# Patient Record
Sex: Male | Born: 1965 | Race: White | Hispanic: No | Marital: Married | Smoking: Never smoker
Health system: Southern US, Community
[De-identification: ages and names within clinical notes are randomized; demographics above are authoritative.]

## PROBLEM LIST (undated history)

## (undated) DIAGNOSIS — E119 Type 2 diabetes mellitus without complications: Secondary | ICD-10-CM

## (undated) DIAGNOSIS — R7302 Impaired glucose tolerance (oral): Secondary | ICD-10-CM

## (undated) DIAGNOSIS — T7840XA Allergy, unspecified, initial encounter: Secondary | ICD-10-CM

## (undated) DIAGNOSIS — M1990 Unspecified osteoarthritis, unspecified site: Secondary | ICD-10-CM

## (undated) DIAGNOSIS — E291 Testicular hypofunction: Secondary | ICD-10-CM

## (undated) DIAGNOSIS — G4733 Obstructive sleep apnea (adult) (pediatric): Secondary | ICD-10-CM

## (undated) HISTORY — DX: Unspecified osteoarthritis, unspecified site: M19.90

## (undated) HISTORY — DX: Testicular hypofunction: E29.1

## (undated) HISTORY — DX: Impaired glucose tolerance (oral): R73.02

## (undated) HISTORY — DX: Obstructive sleep apnea (adult) (pediatric): G47.33

## (undated) HISTORY — DX: Allergy, unspecified, initial encounter: T78.40XA

---

## 1996-11-11 HISTORY — PX: VASECTOMY: SHX75

## 2000-04-21 ENCOUNTER — Emergency Department (HOSPITAL_COMMUNITY): Admission: EM | Admit: 2000-04-21 | Discharge: 2000-04-21 | Payer: Self-pay | Admitting: Emergency Medicine

## 2002-12-27 ENCOUNTER — Emergency Department (HOSPITAL_COMMUNITY): Admission: EM | Admit: 2002-12-27 | Discharge: 2002-12-27 | Payer: Self-pay | Admitting: Emergency Medicine

## 2007-06-23 ENCOUNTER — Ambulatory Visit: Payer: Self-pay

## 2007-12-03 ENCOUNTER — Other Ambulatory Visit: Payer: Self-pay

## 2007-12-04 ENCOUNTER — Observation Stay: Payer: Self-pay | Admitting: Internal Medicine

## 2009-09-28 ENCOUNTER — Emergency Department: Payer: Self-pay | Admitting: Internal Medicine

## 2010-10-31 ENCOUNTER — Emergency Department: Payer: Self-pay | Admitting: Emergency Medicine

## 2010-11-10 ENCOUNTER — Emergency Department: Payer: Self-pay | Admitting: Emergency Medicine

## 2011-09-02 ENCOUNTER — Observation Stay: Payer: Self-pay | Admitting: Internal Medicine

## 2011-09-25 ENCOUNTER — Ambulatory Visit: Payer: Self-pay | Admitting: Family Medicine

## 2011-10-08 ENCOUNTER — Other Ambulatory Visit: Payer: Self-pay | Admitting: Cardiovascular Disease

## 2011-10-09 ENCOUNTER — Encounter (HOSPITAL_COMMUNITY): Payer: Self-pay | Admitting: Pharmacy Technician

## 2011-10-10 ENCOUNTER — Ambulatory Visit (HOSPITAL_COMMUNITY)
Admission: RE | Admit: 2011-10-10 | Discharge: 2011-10-10 | Disposition: A | Discharge status: Home / Self Care | Payer: Federal, State, Local not specified - PPO | Source: Ambulatory Visit | Attending: Cardiovascular Disease | Admitting: Cardiovascular Disease

## 2011-10-10 ENCOUNTER — Other Ambulatory Visit: Payer: Self-pay

## 2011-10-10 ENCOUNTER — Encounter (HOSPITAL_COMMUNITY)
Admission: RE | Disposition: A | Discharge status: Home / Self Care | Payer: Self-pay | Source: Ambulatory Visit | Attending: Cardiovascular Disease

## 2011-10-10 DIAGNOSIS — I200 Unstable angina: Secondary | ICD-10-CM | POA: Insufficient documentation

## 2011-10-10 DIAGNOSIS — Z01810 Encounter for preprocedural cardiovascular examination: Secondary | ICD-10-CM | POA: Insufficient documentation

## 2011-10-10 HISTORY — PX: LEFT HEART CATHETERIZATION WITH CORONARY ANGIOGRAM: SHX5451

## 2011-10-10 SURGERY — LEFT HEART CATHETERIZATION WITH CORONARY ANGIOGRAM
Anesthesia: LOCAL

## 2011-10-10 MED ORDER — MIDAZOLAM HCL 2 MG/2ML IJ SOLN
INTRAMUSCULAR | Status: AC
  Filled 2011-10-10: qty 2

## 2011-10-10 MED ORDER — NITROGLYCERIN 0.2 MG/ML ON CALL CATH LAB
INTRAVENOUS | Status: AC
  Filled 2011-10-10: qty 1

## 2011-10-10 MED ORDER — FENTANYL CITRATE 0.05 MG/ML IJ SOLN
INTRAMUSCULAR | Status: AC
  Filled 2011-10-10: qty 2

## 2011-10-10 MED ORDER — SODIUM CHLORIDE 0.9 % IV SOLN: INTRAVENOUS | Status: DC

## 2011-10-10 MED ORDER — LIDOCAINE HCL (PF) 1 % IJ SOLN
INTRAMUSCULAR | Status: AC
  Filled 2011-10-10: qty 30

## 2011-10-10 MED ORDER — ONDANSETRON HCL 4 MG/2ML IJ SOLN: 4.0000 mg | Freq: Four times a day (QID) | INTRAMUSCULAR | Status: DC | PRN

## 2011-10-10 MED ORDER — VERAPAMIL HCL 2.5 MG/ML IV SOLN
INTRAVENOUS | Status: AC
  Filled 2011-10-10: qty 2

## 2011-10-10 MED ORDER — DIAZEPAM 5 MG PO TABS
5.0000 mg | ORAL_TABLET | ORAL | Status: AC
  Administered 2011-10-10: 5 mg via ORAL
  Filled 2011-10-10: qty 1

## 2011-10-10 MED ORDER — HEPARIN SODIUM (PORCINE) 1000 UNIT/ML IJ SOLN
INTRAMUSCULAR | Status: AC
  Filled 2011-10-10: qty 1

## 2011-10-10 MED ORDER — HEPARIN (PORCINE) IN NACL 2-0.9 UNIT/ML-% IJ SOLN
INTRAMUSCULAR | Status: AC
  Filled 2011-10-10: qty 1000

## 2011-10-10 MED ORDER — SODIUM CHLORIDE 0.9 % IJ SOLN: 3.0000 mL | INTRAMUSCULAR | Status: DC | PRN

## 2011-10-10 MED ORDER — ACETAMINOPHEN 325 MG PO TABS: 650.0000 mg | ORAL_TABLET | ORAL | Status: DC | PRN

## 2011-10-10 MED ORDER — SODIUM CHLORIDE 0.9 % IV SOLN
INTRAVENOUS | Status: DC
  Administered 2011-10-10: 10:00:00 via INTRAVENOUS

## 2011-10-10 NOTE — H&P (Signed)
  H & P will be scanned in.  Pt was reexamined and existing H & P reviewed. No changes found.  Runell Gess, MD [facility] 10/10/2011 10:51 AM

## 2011-10-10 NOTE — Op Note (Signed)
[facility]     CARDIAC CATHETERIZATION REPORT  NAME: Andrew Graham   MRN: [id number] DOB: [birth year]   ADMIT DATE:  10/10/2011  Performing Cardiologist: Runell Gess  Primary Physician: No primary provider on file. Primary Cardiologist:  Allyson Sabal  Procedures Performed:  Left Heart Catheterization via 5 Fr right radial access  Left Ventriculography, (RAO/LAO) 12 ml/sec for 25 ml total contrast  Native Coronary Angiography     Indication(s):   [place]    History: 45 y.o. male without any root cardiac risk factors who recently had an admission for rule out myocardial infarction. He had a negative functional study. He presents now for outpatient diagnostic coronary arteriography to define his anatomy and rule out an ischemic etiology.  Consent: The procedure with Risks/Benefits/Alternatives and Indications was reviewed with the patient).  All questions were answered.    Risks / Complications include, but not limited to: Death, MI, CVA/TIA, VF/VT (with defibrillation), Bradycardia (need for temporary pacer placement), contrast induced nephropathy , bleeding / bruising / hematoma / pseudoaneurysm, vascular or coronary injury (with possible emergent CT or Vascular Surgery), adverse medication reactions, infection.    The patienvoice understanding and agree to proceed.     Consent for signed by MD and patient with RN witness -- placed on chart.  Procedure: The patient was brought to the 2nd Floor [facility] in the fasting state and prepped and draped in the usual sterile fashion for (Right groin or radial) access. ( A modified Allen's test with plethysmography was performed on the right wrist demonstrating adequate Ulnar Artery collateral flow).    Sterile technique was used including antiseptics, cap, gloves, gown, hand hygiene, mask and sheet.  Skin prep: Chlorhexidine;  Time Out: Verified patient identification, verified  procedure, site/side was marked, verified correct patient position, special equipment/implants available, medications/allergies/relevent history reviewed, required imaging and test results available.  Performed    The patient was transported to the Cath Lab in stable condition.   The patient  was stable before, during and following the procedure.   Patient did tolerate procedure well. There were not complications EBL: 0  Medications:  Premedication: 5mg   Valium  Sedation:  1 mg IV Versed, 25 IV mcg Fentanyl  Contrast:  70 Omnipaque    Hemodynamics:  Central Aortic Pressure / Mean Aortic Pressure: 107/74  LV Pressure / LV End diastolic Pressure:  103/8  Left Ventriculography:  EF:  60  Wall Motion: no wall motion abnormalities  Coronary Angiographic Data:  Left Main:  normal  Left Anterior Descending (LAD):  normal    Circumflex (LCx):  normal    Ramus Intermedius:    Right Coronary Artery: normal    Grafts     Impression: 1.  Normal cardiac catheterization   Plan: 1.  Medical therapy 2.  Discharge today home as an outpatient 3.  Return office visit with me in one to 2 weeks  The case and results was discussed with the patient ( and family). The case and results was discussed with the patient's PCP.   Time Spend Directly with Patient:  60 minutes  Runell Gess, M.D., M.S. [facility] [address] [place], [place]  [address]  [phone]  10/10/2011 11:25 AM

## 2011-10-10 NOTE — Progress Notes (Signed)
Unable to get 2nd iv site.  Amy in cath lab notified and stated to bring him over to cath lab with one IV

## 2011-10-12 ENCOUNTER — Ambulatory Visit: Payer: Self-pay | Admitting: Family Medicine

## 2011-10-17 ENCOUNTER — Ambulatory Visit: Payer: Self-pay | Admitting: Family Medicine

## 2011-11-12 ENCOUNTER — Ambulatory Visit: Payer: Self-pay | Admitting: Family Medicine

## 2012-06-08 ENCOUNTER — Ambulatory Visit: Payer: Self-pay | Admitting: Family Medicine

## 2012-09-06 ENCOUNTER — Emergency Department: Payer: Self-pay | Admitting: Emergency Medicine

## 2012-09-18 IMAGING — CR DG FOOT COMPLETE 3+V*L*
1 series · 3 of 3 positions shown · non-contrast
Comparison: none

REASON FOR EXAM: pain attention to fifth toe
COMMENTS:

PROCEDURE:     KDR - KDXR FOOT LT COMP W/OBLIQUES  - October 17, 2011  [DATE]
RESULT:     There is a minimally displaced fracture of the proximal phalanx
of the fifth toe. No other fractures are seen. No radiodense soft tissue
foreign bodies identified.

[Series 1: ap · 0.17mm/px · 3 of 3 slices shown]
[im 1/3]
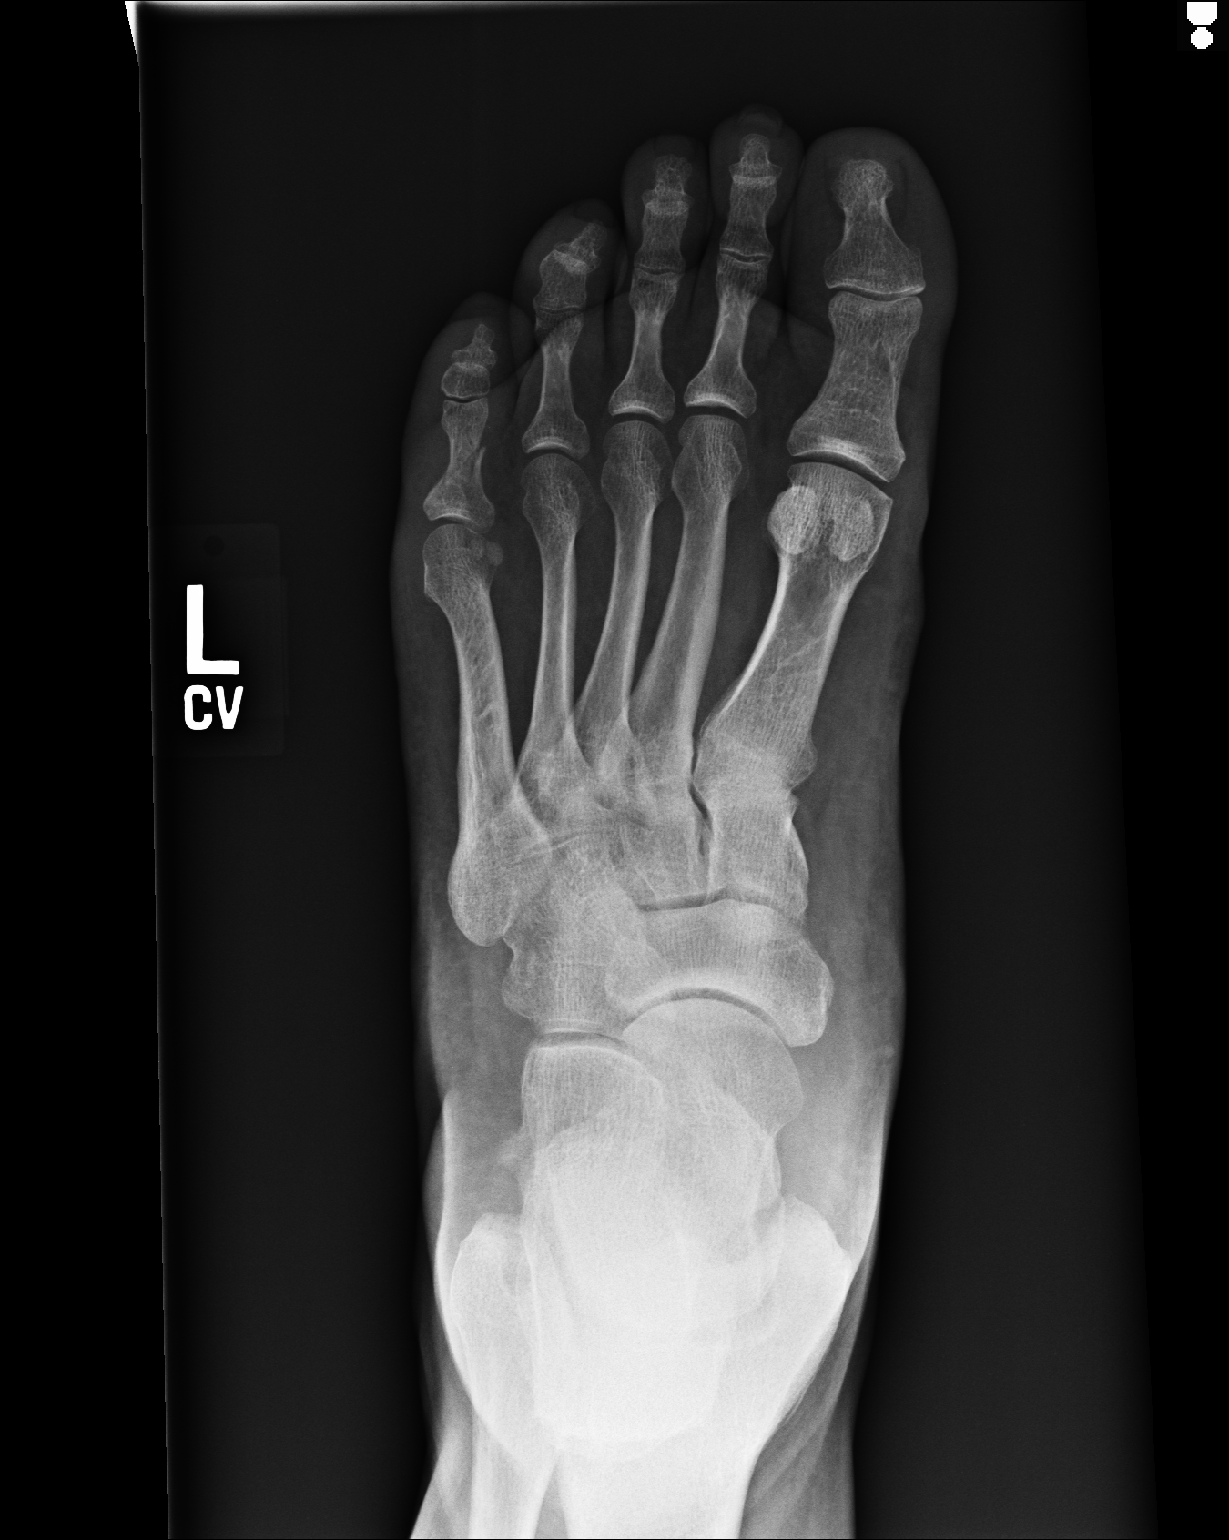
[im 2/3]
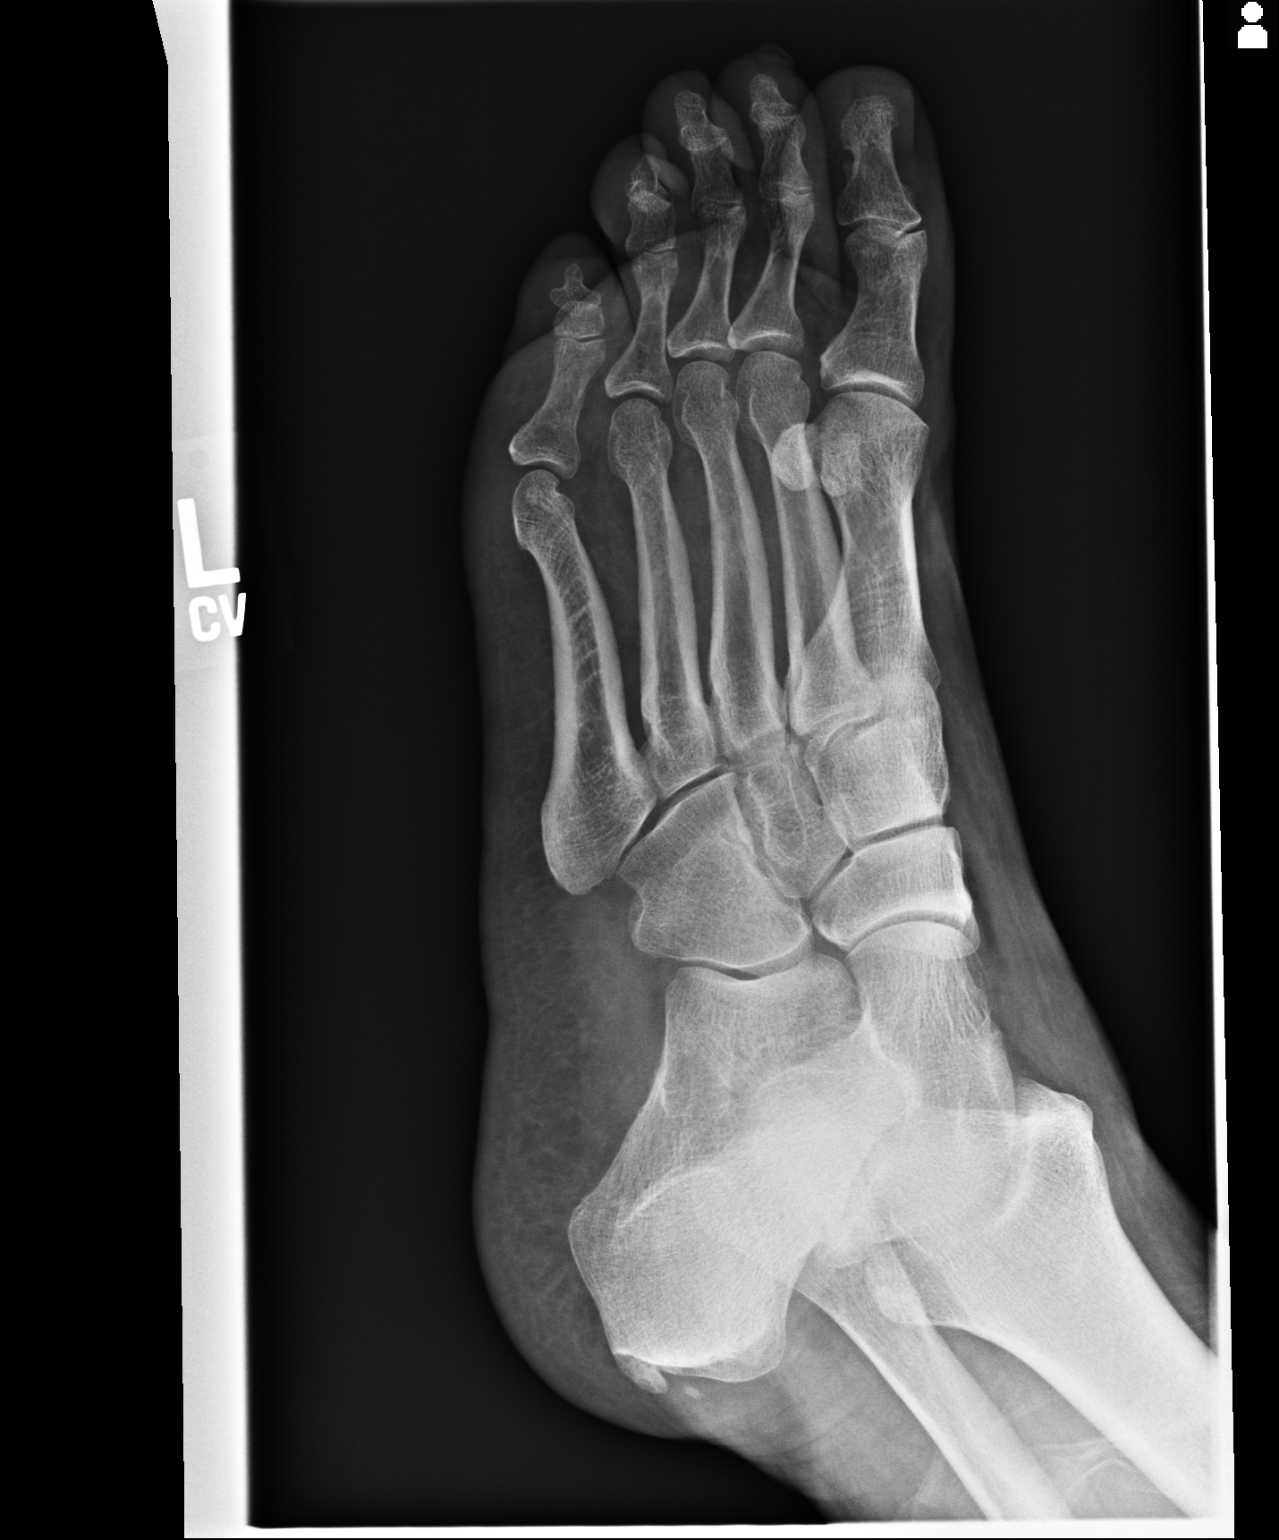
[im 3/3]
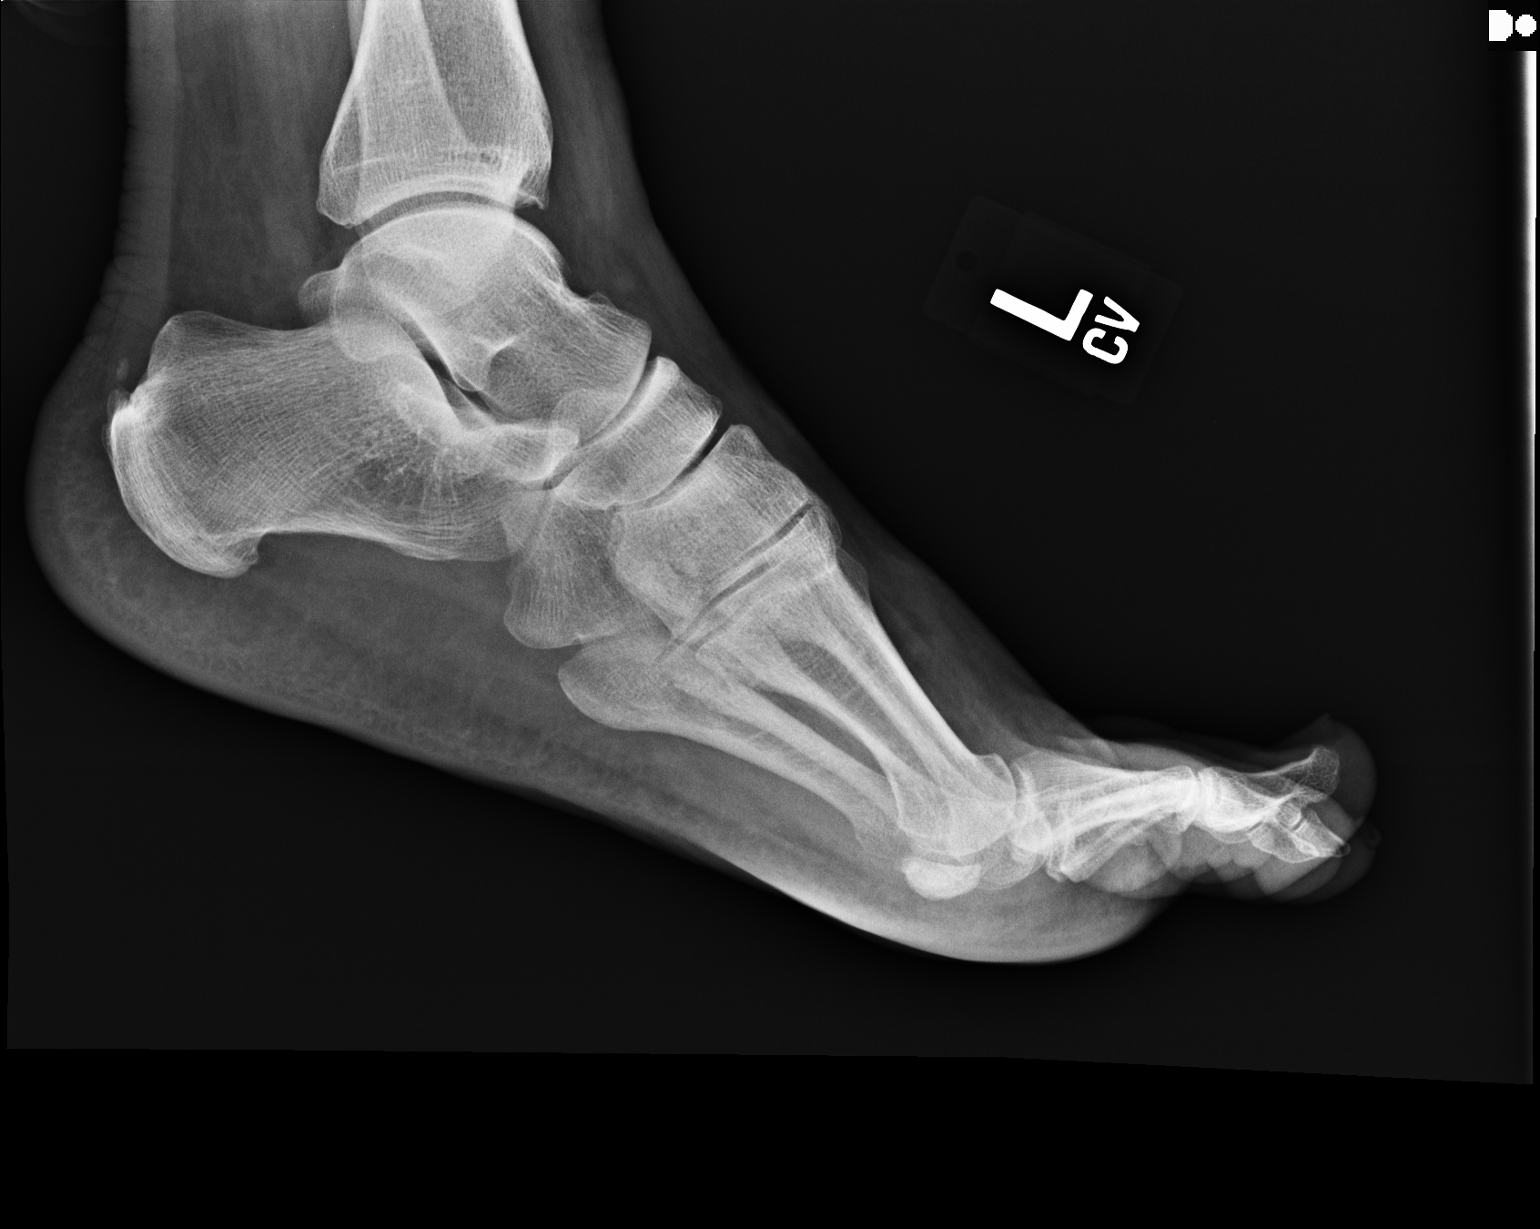

[3 of 3 positions shown; findings below may reference images not displayed]

IMPRESSION: 1. Fracture of the proximal phalanx of the fifth toe, minimally displaced.

## 2012-11-17 ENCOUNTER — Ambulatory Visit: Payer: Self-pay | Admitting: General Practice

## 2014-10-19 ENCOUNTER — Encounter (HOSPITAL_COMMUNITY): Payer: Self-pay | Admitting: Cardiovascular Disease

## 2014-11-14 ENCOUNTER — Encounter: Payer: Self-pay | Admitting: Family Medicine

## 2014-11-14 ENCOUNTER — Ambulatory Visit (INDEPENDENT_AMBULATORY_CARE_PROVIDER_SITE_OTHER): Payer: Federal, State, Local not specified - PPO

## 2014-11-14 ENCOUNTER — Ambulatory Visit (INDEPENDENT_AMBULATORY_CARE_PROVIDER_SITE_OTHER): Payer: Federal, State, Local not specified - PPO | Admitting: Family Medicine

## 2014-11-14 VITALS — BP 130/80 | HR 105 | Temp 98.4°F | Resp 18 | Ht 68.0 in | Wt 356.4 lb

## 2014-11-14 DIAGNOSIS — R21 Rash and other nonspecific skin eruption: Secondary | ICD-10-CM

## 2014-11-14 DIAGNOSIS — M25561 Pain in right knee: Secondary | ICD-10-CM

## 2014-11-14 DIAGNOSIS — E291 Testicular hypofunction: Secondary | ICD-10-CM

## 2014-11-14 DIAGNOSIS — M7989 Other specified soft tissue disorders: Secondary | ICD-10-CM

## 2014-11-14 DIAGNOSIS — M25562 Pain in left knee: Secondary | ICD-10-CM

## 2014-11-14 DIAGNOSIS — R7302 Impaired glucose tolerance (oral): Secondary | ICD-10-CM

## 2014-11-14 LAB — CBC WITH DIFFERENTIAL/PLATELET
BASOS ABS: 0.1 10*3/uL (ref 0.0–0.1)
Basophils Relative: 1 % (ref 0–1)
EOS PCT: 2 % (ref 0–5)
Eosinophils Absolute: 0.2 10*3/uL (ref 0.0–0.7)
HEMATOCRIT: 42.9 % (ref 39.0–52.0)
HEMOGLOBIN: 14.2 g/dL (ref 13.0–17.0)
LYMPHS ABS: 3.4 10*3/uL (ref 0.7–4.0)
LYMPHS PCT: 30 % (ref 12–46)
MCH: 26.5 pg (ref 26.0–34.0)
MCHC: 33.1 g/dL (ref 30.0–36.0)
MCV: 80 fL (ref 78.0–100.0)
MONO ABS: 0.8 10*3/uL (ref 0.1–1.0)
MONOS PCT: 7 % (ref 3–12)
MPV: 9.3 fL (ref 8.6–12.4)
NEUTROS ABS: 6.8 10*3/uL (ref 1.7–7.7)
Neutrophils Relative %: 60 % (ref 43–77)
Platelets: 314 10*3/uL (ref 150–400)
RBC: 5.36 MIL/uL (ref 4.22–5.81)
RDW: 13.9 % (ref 11.5–15.5)
WBC: 11.4 10*3/uL — ABNORMAL HIGH (ref 4.0–10.5)

## 2014-11-14 LAB — COMPREHENSIVE METABOLIC PANEL
ALT: 28 U/L (ref 0–53)
AST: 26 U/L (ref 0–37)
Albumin: 4.1 g/dL (ref 3.5–5.2)
Alkaline Phosphatase: 73 U/L (ref 39–117)
BUN: 15 mg/dL (ref 6–23)
CHLORIDE: 101 meq/L (ref 96–112)
CO2: 23 meq/L (ref 19–32)
Calcium: 9.4 mg/dL (ref 8.4–10.5)
Creat: 0.77 mg/dL (ref 0.50–1.35)
Glucose, Bld: 102 mg/dL — ABNORMAL HIGH (ref 70–99)
POTASSIUM: 4.4 meq/L (ref 3.5–5.3)
Sodium: 134 mEq/L — ABNORMAL LOW (ref 135–145)
Total Bilirubin: 0.4 mg/dL (ref 0.2–1.2)
Total Protein: 8.2 g/dL (ref 6.0–8.3)

## 2014-11-14 LAB — POCT URINALYSIS DIPSTICK
Bilirubin, UA: NEGATIVE
Glucose, UA: NEGATIVE
KETONES UA: NEGATIVE
LEUKOCYTES UA: NEGATIVE
NITRITE UA: NEGATIVE
PH UA: 6
PROTEIN UA: NEGATIVE
Spec Grav, UA: 1.015
Urobilinogen, UA: 1

## 2014-11-14 LAB — TSH: TSH: 3.627 u[IU]/mL (ref 0.350–4.500)

## 2014-11-14 MED ORDER — HYDROCORTISONE 2.5 % EX OINT: TOPICAL_OINTMENT | Freq: Two times a day (BID) | CUTANEOUS | Status: DC

## 2014-11-14 MED ORDER — MELOXICAM 15 MG PO TABS: 15.0000 mg | ORAL_TABLET | Freq: Every day | ORAL | Status: DC
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
## 2014-11-14 NOTE — Patient Instructions (Signed)
Meniscus Tear with Phase I Rehab The meniscus is a C-shaped cartilage structure, located in the knee joint between the thigh bone (femur) and the shinbone (tibia). Two menisci are located in each knee joint: the inner and outer meniscus. The meniscus acts as an adapter between the thigh bone and shinbone, allowing them to fit properly together. It also functions as a shock absorber, to reduce the stress placed on the knee joint and to help supply nutrients to the knee joint cartilage. As people age, the meniscus begins to harden and become more vulnerable to injury. Meniscus tears are a common injury, especially in older athletes. Inner meniscus tears are more common than outer meniscus tears.  SYMPTOMS   Pain in the knee, especially with standing or squatting with the affected leg.  Tenderness along the joint line.  Swelling in the knee joint (effusion), usually starting 1 to 2 days after injury.  Locking or catching of the knee joint, causing inability to straighten the knee completely.  Giving way or buckling of the knee. CAUSES  A meniscus tear occurs when a force is placed on the meniscus that is greater than it can handle. Common causes of injury include:  Direct hit (trauma) to the knee.  Twisting, pivoting, or cutting (rapidly changing direction while running), kneeling or squatting.  Without injury, due to aging. RISK INCREASES WITH:  Contact sports (football, rugby).  Sports in which cleats are used with pivoting (soccer, lacrosse) or sports in which good shoe grip and sudden change in direction are required (racquetball, basketball, squash).  Previous knee injury.  Associated knee injury, particularly ligament injuries.  Poor strength and flexibility. PREVENTION  Warm up and stretch properly before activity.  Maintain physical fitness:  Strength, flexibility, and endurance.  Cardiovascular fitness.  Protect the knee with a brace or elastic bandage.  Wear  properly fitted protective equipment (proper cleats for the surface). PROGNOSIS  Sometimes, meniscus tears heal on their own. However, definitive treatment requires surgery, followed by at least 6 weeks of recovery.  RELATED COMPLICATIONS   Recurring symptoms that result in a chronic problem.  Repeated knee injury, especially if sports are resumed too soon after injury or surgery.  Progression of the tear (the tear gets larger), if untreated.  Arthritis of the knee in later years (with or without surgery).  Complications of surgery, including infection, bleeding, injury to nerves (numbness, weakness, paralysis) continued pain, giving way, locking, nonhealing of meniscus (if repaired), need for further surgery, and knee stiffness (loss of motion). TREATMENT  Treatment first involves the use of ice and medicine, to reduce pain and inflammation. You may find using crutches to walk more comfortable. However, it is okay to bear weight on the injured knee, if the pain will allow it. Surgery is often advised as a definitive treatment. Surgery is performed through an incision near the joint (arthroscopically). The torn piece of the meniscus is removed, and if possible the joint cartilage is repaired. After surgery, the joint must be restrained. After restraint, it is important to perform strengthening and stretching exercises to help regain strength and a full range of motion. These exercises may be completed at home or with a therapist.  MEDICATION  If pain medicine is needed, nonsteroidal anti-inflammatory medicines (aspirin and ibuprofen), or other minor pain relievers (acetaminophen), are often advised.  Do not take pain medicine for 7 days before surgery.  Prescription pain relievers may be given, if your caregiver thinks they are needed. Use only as directed and   only as much as you need. HEAT AND COLD  Cold treatment (icing) should be applied for 10 to 15 minutes every 2 to 3 hours for  inflammation and pain, and immediately after activity that aggravates your symptoms. Use ice packs or an ice massage.  Heat treatment may be used before performing stretching and strengthening activities prescribed by your caregiver, physical therapist, or athletic trainer. Use a heat pack or a warm water soak. SEEK MEDICAL CARE IF:   Symptoms get worse or do not improve in 2 weeks, despite treatment.  New, unexplained symptoms develop. (Drugs used in treatment may produce side effects.) EXERCISES RANGE OF MOTION (ROM) AND STRETCHING EXERCISES - Meniscus Tear, Non-operative, Phase I These are some of the initial exercises with which you may start your rehabilitation program, until you see your caregiver again or until your symptoms are resolved. Remember:   These initial exercises are intended to be gentle. They will help you restore motion without increasing any swelling.  Completing these exercises allows less painful movement and prepares you for the more aggressive strengthening exercises in Phase II.  An effective stretch should be held for at least 30 seconds.  A stretch should never be painful. You should only feel a gentle lengthening or release in the stretched tissue. RANGE OF MOTION - Knee Flexion, Active  Lie on your back with both knees straight. (If this causes back discomfort, bend your healthy knee, placing your foot flat on the floor.)  Slowly slide your heel back toward your buttocks until you feel a gentle stretch in the front of your knee or thigh.  Hold for __________ seconds. Slowly slide your heel back to the starting position. Repeat __________ times. Complete this exercise __________ times per day.  RANGE OF MOTION - Knee Flexion and Extension, Active-Assisted  Sit on the edge of a table or chair with your thighs firmly supported. It may be helpful to place a folded towel under the end of your right / left thigh.  Flexion (bending): Place the ankle of your  healthy leg on top of the other ankle. Use your healthy leg to gently bend your right / left knee until you feel a mild tension across the top of your knee.  Hold for __________ seconds.  Extension (straightening): Switch your ankles so your right / left leg is on top. Use your healthy leg to straighten your right / left knee until you feel a mild tension on the backside of your knee.  Hold for __________ seconds. Repeat __________ times. Complete __________ times per day. STRETCH - Knee Flexion, Supine  Lie on the floor with your right / left heel and foot lightly touching the wall. (Place both feet on the wall if you do not use a door frame.)  Without using any effort, allow gravity to slide your foot down the wall slowly until you feel a gentle stretch in the front of your right / left knee.  Hold this stretch for __________ seconds. Then return the leg to the starting position, using your healthy leg for help, if needed. Repeat __________ times. Complete this stretch __________ times per day.  STRETCH - Knee Extension Sitting  Sit with your right / left leg/heel propped on another chair, coffee table, or foot stool.  Allow your leg muscles to relax, letting gravity straighten out your knee.*  You should feel a stretch behind your right / left knee. Hold this position for __________ seconds. Repeat __________ times. Complete this stretch __________   times per day.  *Your physician, physical therapist or athletic trainer may instruct you place a __________ weight on your thigh, just above your kneecap, to deepen the stretch.  STRENGTHENING EXERCISES - Meniscus Tear, Non-operative, Phase I These exercises may help you when beginning to rehabilitate your injury. They may resolve your symptoms with or without further involvement from your physician, physical therapist or athletic trainer. While completing these exercises, remember:   Muscles can gain both the endurance and the strength  needed for everyday activities through controlled exercises.  Complete these exercises as instructed by your physician, physical therapist or athletic trainer. Progress the resistance and repetitions only as guided. STRENGTH - Quadriceps, Isometrics  Lie on your back with your right / left leg extended and your opposite knee bent.  Gradually tense the muscles in the front of your right / left thigh. You should see either your knee cap slide up toward your hip or increased dimpling just above the knee. This motion will push the back of the knee down toward the floor, mat, or bed on which you are lying.  Hold the muscle as tight as you can, without increasing your pain, for __________ seconds.  Relax the muscles slowly and completely between each repetition. Repeat __________ times. Complete this exercise __________ times per day.  STRENGTH - Quadriceps, Short Arcs   Lie on your back. Place a __________ inch towel roll under your right / left knee, so that the knee bends slightly.  Raise only your lower leg by tightening the muscles in the front of your thigh. Do not allow your thigh to rise.  Hold this position for __________ seconds. Repeat __________ times. Complete this exercise __________ times per day.  OPTIONAL ANKLE WEIGHTS: Begin with ____________________, but DO NOT exceed ____________________. Increase in 1 pound/0.5 kilogram increments. STRENGTH - Quadriceps, Straight Leg Raises  Quality counts! Watch for signs that the quadriceps muscle is working, to be sure you are strengthening the correct muscles and not "cheating" by substituting with healthier muscles.  Lay on your back with your right / left leg extended and your opposite knee bent.  Tense the muscles in the front of your right / left thigh. You should see either your knee cap slide up or increased dimpling just above the knee. Your thigh may even shake a bit.  Tighten these muscles even more and raise your leg 4 to 6  inches off the floor. Hold for __________ seconds.  Keeping these muscles tense, lower your leg.  Relax the muscles slowly and completely in between each repetition. Repeat __________ times. Complete this exercise __________ times per day.  STRENGTH - Hamstring, Curls   Lay on your stomach with your legs extended. (If you lay on a bed, your feet may hang over the edge.)  Tighten the muscles in the back of your thigh to bend your right / left knee up to 90 degrees. Keep your hips flat on the bed.  Hold this position for __________ seconds.  Slowly lower your leg back to the starting position. Repeat __________ times. Complete this exercise __________ times per day.  STRENGTH - Quadriceps, Squats  Stand in a door frame so that your feet and knees are in line with the frame.  Use your hands for balance, not support, on the frame.  Slowly lower your weight, bending at the hips and knees. Keep your lower legs upright so that they are parallel with the door frame. Squat only within the range that does  not increase your knee pain. Never let your hips drop below your knees.  Slowly return upright, pushing with your legs, not pulling with your hands. Repeat __________ times. Complete this exercise __________ times per day.  STRENGTH - Quad/VMO, Isometric   Sit in a chair with your right / left knee slightly bent. With your fingertips, feel the VMO muscle just above the inside of your knee. The VMO is important in controlling the position of your kneecap.  Keeping your fingertips on this muscle. Without actually moving your leg, attempt to drive your knee down as if straightening your leg. You should feel your VMO tense. If you have a difficult time, you may wish to try the same exercise on your healthy knee first.  Tense this muscle as hard as you can without increasing any knee pain.  Hold for __________ seconds. Relax the muscles slowly and completely in between each repetition. Repeat  __________ times. Complete exercise __________ times per day.  Document Released: 11/11/1998 Document Revised: 01/20/2012 Document Reviewed: 02/09/2009 [facility] Patient Information 2015 [place], [place]. This information is not intended to replace advice given to you by your health care provider. Make sure you discuss any questions you have with your health care provider.

## 2014-11-15 LAB — HEMOGLOBIN A1C
Hgb A1c MFr Bld: 6.2 % — ABNORMAL HIGH (ref <5.7)
MEAN PLASMA GLUCOSE: 131 mg/dL — AB (ref <117)

## 2014-12-11 ENCOUNTER — Other Ambulatory Visit: Payer: Self-pay | Admitting: Family Medicine

## 2015-01-11 ENCOUNTER — Ambulatory Visit (INDEPENDENT_AMBULATORY_CARE_PROVIDER_SITE_OTHER): Payer: Federal, State, Local not specified - PPO | Admitting: Family Medicine

## 2015-01-11 ENCOUNTER — Encounter: Payer: Self-pay | Admitting: Family Medicine

## 2015-01-11 VITALS — BP 130/88 | HR 87 | Temp 97.7°F | Resp 16 | Ht 67.8 in | Wt 340.0 lb

## 2015-01-11 DIAGNOSIS — Z0000 Encounter for general adult medical examination without abnormal findings: Secondary | ICD-10-CM

## 2015-01-11 DIAGNOSIS — Z125 Encounter for screening for malignant neoplasm of prostate: Secondary | ICD-10-CM | POA: Diagnosis not present

## 2015-01-11 DIAGNOSIS — Z23 Encounter for immunization: Secondary | ICD-10-CM

## 2015-01-11 DIAGNOSIS — R21 Rash and other nonspecific skin eruption: Secondary | ICD-10-CM | POA: Diagnosis not present

## 2015-01-11 DIAGNOSIS — G4733 Obstructive sleep apnea (adult) (pediatric): Secondary | ICD-10-CM | POA: Diagnosis not present

## 2015-01-11 DIAGNOSIS — L259 Unspecified contact dermatitis, unspecified cause: Secondary | ICD-10-CM

## 2015-01-11 DIAGNOSIS — R7302 Impaired glucose tolerance (oral): Secondary | ICD-10-CM

## 2015-01-11 DIAGNOSIS — E785 Hyperlipidemia, unspecified: Secondary | ICD-10-CM

## 2015-01-11 LAB — LIPID PANEL
Cholesterol: 148 mg/dL (ref 0–200)
HDL: 30 mg/dL — ABNORMAL LOW (ref >=40)
LDL CALC: 83 mg/dL (ref 0–99)
Total CHOL/HDL Ratio: 4.9 Ratio
Triglycerides: 174 mg/dL — ABNORMAL HIGH (ref <150)
VLDL: 35 mg/dL (ref 0–40)

## 2015-01-11 LAB — COMPREHENSIVE METABOLIC PANEL
ALBUMIN: 4.1 g/dL (ref 3.5–5.2)
ALK PHOS: 71 U/L (ref 39–117)
ALT: 43 U/L (ref 0–53)
AST: 29 U/L (ref 0–37)
BUN: 12 mg/dL (ref 6–23)
CALCIUM: 9.4 mg/dL (ref 8.4–10.5)
CHLORIDE: 102 meq/L (ref 96–112)
CO2: 26 mEq/L (ref 19–32)
Creat: 0.84 mg/dL (ref 0.50–1.35)
Glucose, Bld: 98 mg/dL (ref 70–99)
POTASSIUM: 4.5 meq/L (ref 3.5–5.3)
SODIUM: 136 meq/L (ref 135–145)
TOTAL PROTEIN: 8.1 g/dL (ref 6.0–8.3)
Total Bilirubin: 0.5 mg/dL (ref 0.2–1.2)

## 2015-01-11 MED ORDER — HYDROCORTISONE 2.5 % EX OINT: TOPICAL_OINTMENT | Freq: Two times a day (BID) | CUTANEOUS | Status: AC

## 2015-01-11 MED ORDER — PREDNISONE 20 MG PO TABS: ORAL_TABLET | ORAL | Status: DC

## 2015-01-11 NOTE — Patient Instructions (Signed)
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
## 2015-01-12 ENCOUNTER — Encounter: Payer: Self-pay | Admitting: Family Medicine

## 2015-01-12 DIAGNOSIS — R7302 Impaired glucose tolerance (oral): Secondary | ICD-10-CM | POA: Insufficient documentation

## 2015-01-12 DIAGNOSIS — E785 Hyperlipidemia, unspecified: Secondary | ICD-10-CM | POA: Insufficient documentation

## 2015-01-12 DIAGNOSIS — G4733 Obstructive sleep apnea (adult) (pediatric): Secondary | ICD-10-CM | POA: Insufficient documentation

## 2015-01-12 LAB — PSA: PSA: 0.67 ng/mL (ref <=4.00)

## 2015-02-07 ENCOUNTER — Other Ambulatory Visit: Payer: Self-pay | Admitting: Family Medicine

## 2015-03-03 ENCOUNTER — Institutional Professional Consult (permissible substitution): Payer: Federal, State, Local not specified - PPO | Admitting: Neurology

## 2015-03-03 ENCOUNTER — Telehealth: Payer: Self-pay

## 2015-03-03 NOTE — Telephone Encounter (Signed)
Patient did not show to appt.  

## 2015-03-09 ENCOUNTER — Encounter: Payer: Self-pay | Admitting: Neurology

## 2015-06-10 ENCOUNTER — Telehealth: Payer: Self-pay | Admitting: Family Medicine

## 2015-06-10 NOTE — Telephone Encounter (Signed)
Left message with son to give [place] a call back about his new appt date which is 07-21-15 at 8:00

## 2015-07-19 ENCOUNTER — Ambulatory Visit: Payer: Federal, State, Local not specified - PPO | Admitting: Family Medicine

## 2015-07-21 ENCOUNTER — Ambulatory Visit: Payer: Federal, State, Local not specified - PPO | Admitting: Family Medicine

## 2016-02-22 DIAGNOSIS — L299 Pruritus, unspecified: Secondary | ICD-10-CM | POA: Diagnosis not present

## 2016-02-22 DIAGNOSIS — L400 Psoriasis vulgaris: Secondary | ICD-10-CM | POA: Diagnosis not present

## 2016-04-27 DIAGNOSIS — R0602 Shortness of breath: Secondary | ICD-10-CM | POA: Diagnosis not present

## 2016-04-27 DIAGNOSIS — R002 Palpitations: Secondary | ICD-10-CM | POA: Diagnosis not present

## 2016-04-27 DIAGNOSIS — R531 Weakness: Secondary | ICD-10-CM | POA: Diagnosis not present

## 2016-04-27 DIAGNOSIS — R079 Chest pain, unspecified: Secondary | ICD-10-CM | POA: Diagnosis not present

## 2016-04-27 DIAGNOSIS — F439 Reaction to severe stress, unspecified: Secondary | ICD-10-CM | POA: Diagnosis not present

## 2017-02-02 ENCOUNTER — Emergency Department
Admission: EM | Admit: 2017-02-02 | Discharge: 2017-02-02 | Disposition: A | Discharge status: Home / Self Care | Payer: Federal, State, Local not specified - PPO | Attending: Emergency Medicine | Admitting: Emergency Medicine

## 2017-02-02 ENCOUNTER — Encounter: Payer: Self-pay | Admitting: *Deleted

## 2017-02-02 DIAGNOSIS — X58XXXA Exposure to other specified factors, initial encounter: Secondary | ICD-10-CM | POA: Diagnosis not present

## 2017-02-02 DIAGNOSIS — Y929 Unspecified place or not applicable: Secondary | ICD-10-CM | POA: Diagnosis not present

## 2017-02-02 DIAGNOSIS — S39012A Strain of muscle, fascia and tendon of lower back, initial encounter: Secondary | ICD-10-CM

## 2017-02-02 DIAGNOSIS — S3992XA Unspecified injury of lower back, initial encounter: Secondary | ICD-10-CM | POA: Diagnosis present

## 2017-02-02 DIAGNOSIS — Y999 Unspecified external cause status: Secondary | ICD-10-CM | POA: Insufficient documentation

## 2017-02-02 DIAGNOSIS — Y939 Activity, unspecified: Secondary | ICD-10-CM | POA: Insufficient documentation

## 2017-02-02 LAB — URINALYSIS, COMPLETE (UACMP) WITH MICROSCOPIC
BACTERIA UA: NONE SEEN
BILIRUBIN URINE: NEGATIVE
Glucose, UA: NEGATIVE mg/dL
Hgb urine dipstick: NEGATIVE
KETONES UR: NEGATIVE mg/dL
LEUKOCYTES UA: NEGATIVE
Nitrite: NEGATIVE
PROTEIN: NEGATIVE mg/dL
RBC / HPF: NONE SEEN RBC/hpf (ref 0–5)
Specific Gravity, Urine: 1.018 (ref 1.005–1.030)
pH: 5 (ref 5.0–8.0)

## 2017-02-02 MED ORDER — KETOROLAC TROMETHAMINE 60 MG/2ML IM SOLN
60.0000 mg | Freq: Once | INTRAMUSCULAR | Status: AC
  Administered 2017-02-02: 60 mg via INTRAMUSCULAR
  Filled 2017-02-02: qty 2

## 2017-02-02 MED ORDER — METHOCARBAMOL 750 MG PO TABS: 750.0000 mg | ORAL_TABLET | Freq: Four times a day (QID) | ORAL | 0 refills | Status: DC

## 2017-02-02 MED ORDER — DIAZEPAM 5 MG PO TABS
5.0000 mg | ORAL_TABLET | Freq: Once | ORAL | Status: AC
  Administered 2017-02-02: 5 mg via ORAL
  Filled 2017-02-02: qty 1

## 2017-02-02 MED ORDER — IBUPROFEN 800 MG PO TABS: 800.0000 mg | ORAL_TABLET | Freq: Three times a day (TID) | ORAL | 0 refills | Status: AC | PRN

## 2017-02-02 NOTE — ED Triage Notes (Signed)
Pt complains of left lower back pain, pt denies any other symptoms

## 2017-02-02 NOTE — ED Notes (Signed)
Pt reports lower back pain on the left side. Started x 1 week ago, pt used OTC pain patches and it seemed to get better, yesterday pain started to get worse again and then when waking up this morning pain was acutely worse. Pt denies any known injury, non heavy lifting, twisting, or turning.  Pt denies radiation of pain into leg, numbness or tingling. No kidney tenderness noted.

## 2017-02-02 NOTE — ED Provider Notes (Signed)
[facility] Emergency Department Provider Note   ____________________________________________   First MD Initiated Contact with Patient 02/02/17 1132     (approximate)  I have reviewed the triage vital signs and the nursing notes.   HISTORY  Chief Complaint Back Pain    HPI Andrew Graham is a 51 y.o. male who presents for evaluation of low back pain 2-3 days progressively getting worse. Patient states that the pain is not exacerbated by walking sometimes it is with movement. Reports that when he is sitting or walking he can be pain-free but with certain movements it becomes off the chart. Denies any direct trauma to the area. Denies any urinary changes or frequency.   Past Medical History:  Diagnosis Date  . Allergy   . Arthritis   . Glucose intolerance (impaired glucose tolerance)   . Hypogonadism in male   . Obstructive sleep apnea     Patient Active Problem List   Diagnosis Date Noted  . Morbid obesity ([place]) 01/12/2015  . Glucose intolerance (impaired glucose tolerance) 01/12/2015  . OSA (obstructive sleep apnea) 01/12/2015  . Dyslipidemia 01/12/2015    Past Surgical History:  Procedure Laterality Date  . LEFT HEART CATHETERIZATION WITH CORONARY ANGIOGRAM N/A 10/10/2011   Procedure: LEFT HEART CATHETERIZATION WITH CORONARY ANGIOGRAM;  Surgeon: Andrew Harp, MD;  Location: [facility] CATH LAB;  Service: Cardiovascular;  Laterality: N/A;  . VASECTOMY  1998    Prior to Admission medications   Medication Sig Start Date End Date Taking? Authorizing Provider  hydrocortisone 2.5 % ointment Apply topically 2 (two) times daily. 01/11/15   Wardell Honour, MD  meloxicam (MOBIC) 15 MG tablet Take 1 tablet (15 mg total) by mouth daily. 11/14/14   Wardell Honour, MD  omeprazole (PRILOSEC) 20 MG capsule Take 20 mg by mouth daily.      Historical Provider, MD  predniSONE (DELTASONE) 20 MG tablet Three tablets daily x 2 days then two tablets daily x 5 days  then one tablet daily x 5 days 01/11/15   Wardell Honour, MD  testosterone cypionate (DEPOTESTOTERONE CYPIONATE) 100 MG/ML injection Inject 100 mg into the muscle every 14 (fourteen) days. For IM use only     Historical Provider, MD    Allergies Tomato; Nutritional supplements; Strawberry extract; and Sulfa drugs cross reactors  Family History  Problem Relation Age of Onset  . Cancer Maternal Grandmother     throat  . Parkinson's disease Mother   . Obesity Mother   . Parkinson's disease      Social History Social History  Substance Use Topics  . Smoking status: Never Smoker  . Smokeless tobacco: Never Used  . Alcohol use 0.0 oz/week     Comment: rarely - 1 to 2    Review of Systems Constitutional: No fever/chills Cardiovascular: Denies chest pain. Respiratory: Denies shortness of breath. Gastrointestinal: No abdominal pain.  No nausea, no vomiting.  No diarrhea.  No constipation. Genitourinary: Negative for dysuria. Musculoskeletal: Positive for low back pain. Skin: Negative for rash. Neurological: Negative for headaches, focal weakness or numbness.  10-point ROS otherwise negative.  ____________________________________________   PHYSICAL EXAM:  VITAL SIGNS: ED Triage Vitals  Enc Vitals Group     BP 02/02/17 1112 135/87     Pulse Rate 02/02/17 1112 80     Resp 02/02/17 1112 20     Temp 02/02/17 1112 97.5 F (36.4 C)     Temp Source 02/02/17 1112 Oral  SpO2 02/02/17 1112 97 %     Weight 02/02/17 1111 (!) 340 lb (154.2 kg)     Height 02/02/17 1111 5\' 8"  (1.727 m)     Head Circumference --      Peak Flow --      Pain Score 02/02/17 1111 10     Pain Loc --      Pain Edu? --      Excl. in [place]? --     Constitutional: Alert and oriented. Well appearing and in no acute distress. Neck: No stridor. Full full range of motion nontender. Cardiovascular: Normal rate, regular rhythm. Grossly normal heart sounds.  Good peripheral circulation. Respiratory: Normal  respiratory effort.  No retractions. Lungs CTAB. Gastrointestinal: Soft and nontender. No distention. No abdominal bruits. CVA tenderness on the left only. Musculoskeletal: No lower extremity tenderness nor edema.  No joint effusions. Neurologic:  Normal speech and language. No gross focal neurologic deficits are appreciated. No gait instability.However, ambulates very slowly methodically. Skin:  Skin is warm, dry and intact. No rash noted. Psychiatric: Mood and affect are normal. Speech and behavior are normal.  ____________________________________________   LABS (all labs ordered are listed, but only abnormal results are displayed)  Labs Reviewed - No data to display ____________________________________________  EKG   ____________________________________________  RADIOLOGY   ____________________________________________   PROCEDURES  Procedure(s) performed: None  Procedures  Critical Care performed: No  ____________________________________________   INITIAL IMPRESSION / ASSESSMENT AND PLAN / ED COURSE  Pertinent labs & imaging results that were available during my care of the patient were reviewed by me and considered in my medical decision making (see chart for details).  Acute lumbosacral strain. Rx given for Robaxin 750 4 times a day Motrin 800-TIF. Patient follow-up PCP or return here with any worsening symptomology      ____________________________________________   FINAL CLINICAL IMPRESSION(S) / ED DIAGNOSES  Final diagnoses:  None      NEW MEDICATIONS STARTED DURING THIS VISIT:  New Prescriptions   No medications on file     Note:  This document was prepared using Dragon voice recognition software and may include unintentional dictation errors.   [name], PA-C 02/02/17 1306    Rudene Re, MD 02/04/17 Lurline Hare

## 2017-02-27 DIAGNOSIS — M1711 Unilateral primary osteoarthritis, right knee: Secondary | ICD-10-CM | POA: Diagnosis not present

## 2017-05-15 ENCOUNTER — Encounter: Payer: Self-pay | Admitting: Family Medicine

## 2017-05-15 ENCOUNTER — Ambulatory Visit (INDEPENDENT_AMBULATORY_CARE_PROVIDER_SITE_OTHER): Payer: Federal, State, Local not specified - PPO | Admitting: Family Medicine

## 2017-05-15 VITALS — BP 128/83 | HR 108 | Temp 99.2°F | Resp 16 | Ht 68.0 in | Wt 315.6 lb

## 2017-05-15 DIAGNOSIS — R062 Wheezing: Secondary | ICD-10-CM | POA: Diagnosis not present

## 2017-05-15 DIAGNOSIS — R05 Cough: Secondary | ICD-10-CM

## 2017-05-15 DIAGNOSIS — J329 Chronic sinusitis, unspecified: Secondary | ICD-10-CM

## 2017-05-15 DIAGNOSIS — H6123 Impacted cerumen, bilateral: Secondary | ICD-10-CM | POA: Diagnosis not present

## 2017-05-15 DIAGNOSIS — J209 Acute bronchitis, unspecified: Secondary | ICD-10-CM

## 2017-05-15 DIAGNOSIS — R059 Cough, unspecified: Secondary | ICD-10-CM

## 2017-05-15 MED ORDER — ALBUTEROL SULFATE HFA 108 (90 BASE) MCG/ACT IN AERS: 2.0000 | INHALATION_SPRAY | RESPIRATORY_TRACT | 1 refills | Status: AC | PRN

## 2017-05-15 MED ORDER — BENZONATATE 100 MG PO CAPS: 100.0000 mg | ORAL_CAPSULE | Freq: Three times a day (TID) | ORAL | 0 refills | Status: DC | PRN

## 2017-05-15 MED ORDER — AMOXICILLIN-POT CLAVULANATE 875-125 MG PO TABS: 1.0000 | ORAL_TABLET | Freq: Two times a day (BID) | ORAL | 0 refills | Status: DC

## 2017-05-15 MED ORDER — PREDNISONE 20 MG PO TABS: ORAL_TABLET | ORAL | 0 refills | Status: DC

## 2017-05-15 NOTE — Progress Notes (Signed)
Patient ID: Andrew Graham, male    DOB: [birth year]  Age: 51 y.o. MRN: [id number]  Chief Complaint  Patient presents with  . Nasal Congestion    x 3 weeks   . Cough  . Chest Pain    from coughing     Subjective:   Patient has had a respiratory tract problem for the last month or so with a persistent cough. Over the past for 5 days he's had more purulent head drainage and coughing up purulent mucus. He does not smoke. He has been coughing fairly incessantly despite taking over-the-counter cough medications. He has not been febrile. No analysis home is ill. He does not get a lot of prolonged respiratory tract infections like this.  Current allergies, medications, problem list, past/family and social histories reviewed.  Objective:  BP 128/83   Pulse (!) 108   Temp 99.2 F (37.3 C) (Oral)   Resp 16   Ht 5\' 8"  (1.727 m)   Wt (!) 315 lb 9.6 oz (143.2 kg)   SpO2 97%   BMI 47.99 kg/m   Overweight man, breathing hard. His TMs are both occluded by cerumen. His throat has yellow drainage by the uvula, not erythematous. Neck supple without significant nodes. Chest has decreased air exchange, coughing a lot but no major wheezing at this time. He has notice the whistling on his own. His heart is regular without murmur.  Assessment & Plan:   Assessment: 1. Cough   2. Acute bronchitis, unspecified organism   3. Chronic sinusitis, unspecified location   4. Wheeze       Plan: See instructions.  No orders of the defined types were placed in this encounter.   Meds ordered this encounter  Medications  . albuterol (PROVENTIL HFA;VENTOLIN HFA) 108 (90 Base) MCG/ACT inhaler    Sig: Inhale 2 puffs into the lungs every 4 (four) hours as needed for wheezing or shortness of breath (cough, shortness of breath or wheezing.).    Dispense:  1 Inhaler    Refill:  1  . predniSONE (DELTASONE) 20 MG tablet    Sig: Take 3 daily for 2 days, then 2 daily for 2 days, then 1 daily for 2 days for  infection.    Dispense:  12 tablet    Refill:  0  . benzonatate (TESSALON) 100 MG capsule    Sig: Take 1-2 capsules (100-200 mg total) by mouth 3 (three) times daily as needed for cough.    Dispense:  40 capsule    Refill:  0  . amoxicillin-clavulanate (AUGMENTIN) 875-125 MG tablet    Sig: Take 1 tablet by mouth 2 (two) times daily.    Dispense:  14 tablet    Refill:  0         Patient Instructions   Drink plenty of fluids and get enough rest  Use the inhaler 2 inhalations every 4-6 hours (approximately 4 times daily) as directed  Take the benzonatate cough pills one or 2 pills 3 times daily as needed for cough  Take the Augmentin (amoxicillin/clavulanate) antibiotic one twice daily for 1 full week  Take prednisone 3 pills daily for 2 days, then 2 daily for 2 days, then 1 daily for 2 days. Best taken after breakfast. Take first pills today however.  Return if worse or not improving.    IF you received an x-ray today, you will receive an invoice from [facility]. Please contact [facility] at [phone] with questions or concerns regarding your invoice.  IF you received labwork today, you will receive an invoice from [facility]. Please contact [facility] at [phone] with questions or concerns regarding your invoice.   Our billing staff will not be able to assist you with questions regarding bills from these companies.  You will be contacted with the lab results as soon as they are available. The fastest way to get your results is to activate your My Chart account. Instructions are located on the last page of this paperwork. If you have not heard from [place] regarding the results in 2 weeks, please contact this office.         No Follow-up on file.   HOPPER,DAVID, MD 05/15/2017

## 2017-05-15 NOTE — Patient Instructions (Addendum)
Drink plenty of fluids and get enough rest  Use the inhaler 2 inhalations every 4-6 hours (approximately 4 times daily) as directed  Take the benzonatate cough pills one or 2 pills 3 times daily as needed for cough  Take the Augmentin (amoxicillin/clavulanate) antibiotic one twice daily for 1 full week  Take prednisone 3 pills daily for 2 days, then 2 daily for 2 days, then 1 daily for 2 days. Best taken after breakfast. Take first pills today however.  Return if worse or not improving.  Your ears are both full of wax. You can buy at the pharmacy over-the-counter Debrox which comes with a little squirt bulb. Use this to try and clean your years out. It is a good idea to put it on your calendar to remember to flush them out a couple of times a year.    IF you received an x-ray today, you will receive an invoice from [facility]. Please contact [facility] at [phone] with questions or concerns regarding your invoice.   IF you received labwork today, you will receive an invoice from [place]. Please contact [facility] at [phone] with questions or concerns regarding your invoice.   Our billing staff will not be able to assist you with questions regarding bills from these companies.  You will be contacted with the lab results as soon as they are available. The fastest way to get your results is to activate your My Chart account. Instructions are located on the last page of this paperwork. If you have not heard from [place] regarding the results in 2 weeks, please contact this office.

## 2017-06-24 DIAGNOSIS — S86812A Strain of other muscle(s) and tendon(s) at lower leg level, left leg, initial encounter: Secondary | ICD-10-CM | POA: Diagnosis not present

## 2017-12-02 ENCOUNTER — Other Ambulatory Visit: Payer: Self-pay | Admitting: Orthopedic Surgery

## 2017-12-02 DIAGNOSIS — M25561 Pain in right knee: Secondary | ICD-10-CM

## 2017-12-02 DIAGNOSIS — M1711 Unilateral primary osteoarthritis, right knee: Secondary | ICD-10-CM | POA: Diagnosis not present

## 2017-12-02 DIAGNOSIS — S83241A Other tear of medial meniscus, current injury, right knee, initial encounter: Secondary | ICD-10-CM | POA: Diagnosis not present

## 2017-12-06 ENCOUNTER — Other Ambulatory Visit: Payer: Federal, State, Local not specified - PPO

## 2017-12-13 ENCOUNTER — Ambulatory Visit
Admission: RE | Admit: 2017-12-13 | Discharge: 2017-12-13 | Disposition: A | Discharge status: Home / Self Care | Payer: Federal, State, Local not specified - PPO | Source: Ambulatory Visit | Attending: Orthopedic Surgery | Admitting: Orthopedic Surgery

## 2017-12-13 DIAGNOSIS — M25561 Pain in right knee: Secondary | ICD-10-CM

## 2017-12-13 DIAGNOSIS — S83231A Complex tear of medial meniscus, current injury, right knee, initial encounter: Secondary | ICD-10-CM | POA: Diagnosis not present

## 2018-01-22 ENCOUNTER — Encounter (INDEPENDENT_AMBULATORY_CARE_PROVIDER_SITE_OTHER): Payer: Federal, State, Local not specified - PPO

## 2018-01-27 ENCOUNTER — Ambulatory Visit (INDEPENDENT_AMBULATORY_CARE_PROVIDER_SITE_OTHER): Payer: Federal, State, Local not specified - PPO | Admitting: Family Medicine

## 2018-03-03 ENCOUNTER — Ambulatory Visit: Payer: Federal, State, Local not specified - PPO | Admitting: Family Medicine

## 2018-04-02 ENCOUNTER — Encounter: Payer: Self-pay | Admitting: Family Medicine

## 2018-05-11 ENCOUNTER — Emergency Department
Admission: EM | Admit: 2018-05-11 | Discharge: 2018-05-11 | Disposition: A | Discharge status: Home / Self Care | Payer: Federal, State, Local not specified - PPO | Attending: Emergency Medicine | Admitting: Emergency Medicine

## 2018-05-11 ENCOUNTER — Emergency Department: Payer: Federal, State, Local not specified - PPO

## 2018-05-11 ENCOUNTER — Other Ambulatory Visit: Payer: Self-pay

## 2018-05-11 DIAGNOSIS — R071 Chest pain on breathing: Secondary | ICD-10-CM | POA: Insufficient documentation

## 2018-05-11 DIAGNOSIS — R5383 Other fatigue: Secondary | ICD-10-CM | POA: Insufficient documentation

## 2018-05-11 DIAGNOSIS — R0602 Shortness of breath: Secondary | ICD-10-CM | POA: Diagnosis not present

## 2018-05-11 DIAGNOSIS — R450 Nervousness: Secondary | ICD-10-CM | POA: Insufficient documentation

## 2018-05-11 LAB — FIBRIN DERIVATIVES D-DIMER (ARMC ONLY): FIBRIN DERIVATIVES D-DIMER (ARMC): 1325.79 ng{FEU}/mL — AB (ref 0.00–499.00)

## 2018-05-11 LAB — BASIC METABOLIC PANEL
Anion gap: 7 (ref 5–15)
BUN: 10 mg/dL (ref 6–20)
CHLORIDE: 104 mmol/L (ref 98–111)
CO2: 24 mmol/L (ref 22–32)
Calcium: 8.5 mg/dL — ABNORMAL LOW (ref 8.9–10.3)
Creatinine, Ser: 0.62 mg/dL (ref 0.61–1.24)
GFR calc Af Amer: >60 mL/min (ref >60)
GFR calc non Af Amer: >60 mL/min (ref >60)
Glucose, Bld: 122 mg/dL — ABNORMAL HIGH (ref 70–99)
POTASSIUM: 4.1 mmol/L (ref 3.5–5.1)
SODIUM: 135 mmol/L (ref 135–145)

## 2018-05-11 LAB — CBC
HEMATOCRIT: 38.2 % — AB (ref 40.0–52.0)
HEMOGLOBIN: 13.4 g/dL (ref 13.0–18.0)
MCH: 27 pg (ref 26.0–34.0)
MCHC: 35 g/dL (ref 32.0–36.0)
MCV: 77 fL — ABNORMAL LOW (ref 80.0–100.0)
Platelets: 202 10*3/uL (ref 150–440)
RBC: 4.96 MIL/uL (ref 4.40–5.90)
RDW: 14.6 % — ABNORMAL HIGH (ref 11.5–14.5)
WBC: 8.6 10*3/uL (ref 3.8–10.6)

## 2018-05-11 LAB — TROPONIN I: Troponin I: <0.03 ng/mL (ref <0.03)

## 2018-05-11 LAB — BRAIN NATRIURETIC PEPTIDE: B Natriuretic Peptide: 17 pg/mL (ref 0.0–100.0)

## 2018-05-11 MED ORDER — IOPAMIDOL (ISOVUE-370) INJECTION 76%
100.0000 mL | Freq: Once | INTRAVENOUS | Status: AC | PRN
  Administered 2018-05-11: 100 mL via INTRAVENOUS

## 2018-05-11 NOTE — ED Notes (Signed)
PT back in treatment room after CT. PT watching TV, in NAD. Bed is locked and in lowest position with call bell in reach.

## 2018-05-11 NOTE — ED Notes (Signed)
First Nurse Note: Patient complaining of feeling "winded".  States symptoms began last week on vacation and have not improved.  Having to sit down to rest.  Pulse ox - 98% on RA.  Color good.  States he has pain with deep breaths.  Denies any known edema.

## 2018-05-11 NOTE — ED Provider Notes (Signed)
[facility] Emergency Department Provider Note       Time seen: ----------------------------------------- 10:46 AM on 05/11/2018 -----------------------------------------   I have reviewed the triage vital signs and the nursing notes.  HISTORY   Chief Complaint Shortness of Breath    HPI Andrew Graham is a 52 y.o. male with a history of allergies, sleep apnea, hyperlipidemia who presents to the ED for shortness of breath with exertion for the last week.  Patient does not have a history of same.  Patient states he feels fatigued and jittery with any exertion.  He states symptoms started on vacation last week and have not improved.  He has to sit down to rest but he does also have pain with deep breathing.  He denies any edema.  Past Medical History:  Diagnosis Date  . Allergy   . Arthritis   . Glucose intolerance (impaired glucose tolerance)   . Hypogonadism in male   . Obstructive sleep apnea     Patient Active Problem List   Diagnosis Date Noted  . Morbid obesity ([place]) 01/12/2015  . Glucose intolerance (impaired glucose tolerance) 01/12/2015  . OSA (obstructive sleep apnea) 01/12/2015  . Dyslipidemia 01/12/2015    Past Surgical History:  Procedure Laterality Date  . LEFT HEART CATHETERIZATION WITH CORONARY ANGIOGRAM N/A 10/10/2011   Procedure: LEFT HEART CATHETERIZATION WITH CORONARY ANGIOGRAM;  Surgeon: Lorretta Harp, MD;  Location: [facility] CATH LAB;  Service: Cardiovascular;  Laterality: N/A;  . VASECTOMY  1998    Allergies Tomato; Nutritional supplements; Strawberry extract; and Sulfa drugs cross reactors  Social History Social History   Tobacco Use  . Smoking status: Never Smoker  . Smokeless tobacco: Never Used  Substance Use Topics  . Alcohol use: Yes    Alcohol/week: 0.0 oz    Comment: rarely - 1 to 2  . Drug use: No   Review of Systems Constitutional: Negative for fever. Cardiovascular: Negative for chest  pain. Respiratory: Positive for shortness of breath Gastrointestinal: Negative for abdominal pain, vomiting and diarrhea. Musculoskeletal: Negative for back pain. Skin: Negative for rash. Neurological: Negative for headaches, focal weakness or numbness.  All systems negative/normal/unremarkable except as stated in the HPI  ____________________________________________   PHYSICAL EXAM:  VITAL SIGNS: ED Triage Vitals [05/11/18 1031]  Enc Vitals Group     BP 125/78     Pulse Rate 89     Resp 16     Temp 98.5 F (36.9 C)     Temp Source Oral     SpO2 100 %     Weight (!) 350 lb (158.8 kg)     Height 5\' 8"  (1.727 m)     Head Circumference      Peak Flow      Pain Score 0     Pain Loc      Pain Edu?      Excl. in [place]?    Constitutional: Alert and oriented. Well appearing and in no distress. Eyes: Conjunctivae are normal. Normal extraocular movements. ENT   Head: Normocephalic and atraumatic.   Nose: No congestion/rhinnorhea.   Mouth/Throat: Mucous membranes are moist.   Neck: No stridor. Cardiovascular: Normal rate, regular rhythm. No murmurs, rubs, or gallops. Respiratory: Normal respiratory effort without tachypnea nor retractions. Breath sounds are clear and equal bilaterally. No wheezes/rales/rhonchi. Gastrointestinal: Soft and nontender. Normal bowel sounds Musculoskeletal: Nontender with normal range of motion in extremities. No lower extremity tenderness nor edema. Neurologic:  Normal speech and language. No gross focal  neurologic deficits are appreciated.  Skin:  Skin is warm, dry and intact. No rash noted. Psychiatric: Mood and affect are normal. Speech and behavior are normal.  ____________________________________________  EKG: Interpreted by me.  Sinus rhythm the rate of 94 bpm, normal PR interval, normal QRS, normal QT.  ____________________________________________  ED COURSE:  As part of my medical decision making, I reviewed the following data  within the [place] History obtained from family if available, nursing notes, old chart and ekg, as well as notes from prior ED visits. Patient presented for dyspnea on exertion, we will assess with labs and imaging as indicated at this time.   Procedures ____________________________________________   LABS (pertinent positives/negatives)  Labs Reviewed  CBC - Abnormal; Notable for the following components:      Result Value   HCT 38.2 (*)    MCV 77.0 (*)    RDW 14.6 (*)    All other components within normal limits  BASIC METABOLIC PANEL - Abnormal; Notable for the following components:   Glucose, Bld 122 (*)    Calcium 8.5 (*)    All other components within normal limits  FIBRIN DERIVATIVES D-DIMER ([facility] ONLY) - Abnormal; Notable for the following components:   Fibrin derivatives D-dimer ([facility]) 1,325.79 (*)    All other components within normal limits  TROPONIN I  BRAIN NATRIURETIC PEPTIDE    RADIOLOGY Images were viewed by me  Chest x-ray IMPRESSION: No active cardiopulmonary disease. IMPRESSION: 1. No evidence of pulmonary embolism. No acute intrathoracic process.  ____________________________________________  DIFFERENTIAL DIAGNOSIS   PE, sleep apnea, pneumonia, MI, unstable angina, cardiomyopathy  FINAL ASSESSMENT AND PLAN  Dyspnea   Plan: The patient had presented for dyspnea on exertion. Patient's labs were negative with the exception of elevated d-dimer. Patient's imaging was negative for PE.  He will need close outpatient follow-up with cardiology and echocardiogram.  Otherwise he is cleared for outpatient follow-up at this time.  I suspect this is likely multifactorial, unfortunately he has been off of CPAP for obstructive sleep apnea for the last 6 years.   Laurence Aly, MD   Note: This note was generated in part or whole with voice recognition software. Voice recognition is usually quite accurate but there are transcription  errors that can and very often do occur. I apologize for any typographical errors that were not detected and corrected.     Earleen Newport, MD 05/11/18 [phone]

## 2018-05-11 NOTE — ED Notes (Signed)
Patient transported to CT 

## 2018-05-11 NOTE — ED Triage Notes (Signed)
SOB with exertion X 1 week. No hx of same. Pt states he feels fatigued and jittery with exertion.  Pt alert and oriented X4, active, cooperative, pt in NAD. RR even and unlabored, color WNL.

## 2018-05-19 ENCOUNTER — Ambulatory Visit: Payer: Federal, State, Local not specified - PPO | Admitting: Cardiovascular Disease

## 2018-05-26 ENCOUNTER — Ambulatory Visit: Payer: Federal, State, Local not specified - PPO | Admitting: Cardiovascular Disease

## 2018-05-26 ENCOUNTER — Encounter: Payer: Self-pay | Admitting: Cardiovascular Disease

## 2018-05-26 VITALS — BP 124/80 | HR 90 | Ht 68.0 in | Wt 322.4 lb

## 2018-05-26 DIAGNOSIS — G4730 Sleep apnea, unspecified: Secondary | ICD-10-CM | POA: Diagnosis not present

## 2018-05-26 DIAGNOSIS — R0609 Other forms of dyspnea: Secondary | ICD-10-CM | POA: Diagnosis not present

## 2018-05-26 DIAGNOSIS — R0600 Dyspnea, unspecified: Secondary | ICD-10-CM

## 2018-05-26 NOTE — Assessment & Plan Note (Signed)
New onset dyspnea on exertion starting over the last several weeks.  He was on vacation in [place] and [place] and was seen in the [place] ER on 05/11/2018 with pleuritic chest pain and dyspnea.  A d-dimer was mildly elevated a CTA was negative for PE.  I am going to get a 2D echo to further evaluate.  Of note, I did perform outpatient diagnostic coronary arteriography on him 10/10/2011 which was entirely normal.

## 2018-05-26 NOTE — Progress Notes (Signed)
05/26/2018 Andrew Graham   [birth year]  [id number]  Primary Physician Wardell Honour, MD Primary Cardiologist: Lorretta Harp MD Renae Gloss  HPI:  Andrew Graham is a 52 y.o. morbidly overweight married Caucasian male father of 2, grandfather of 52 young grandchild referred by the ER for evaluation of dyspnea.  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  He does not smoke, he drinks socially and has otherwise no other risk factors.  I did perform a cardiac catheterization on him as an outpatient because of chest pain 10/10/2011 which was entirely normal.  He is done well since until he went on vacation to [place] in late June.  He came back feeling fatigued with pleuritic chest pain.  He was seen in the [facility] ER where a d-dimer was mildly elevated and a CTA was negative.  His work-up otherwise was unremarkable.  He has gotten progressively better over the last 2 weeks.   Current Meds  Medication Sig  . aspirin 81 MG chewable tablet Chew 81 mg by mouth daily.  . famotidine (PEPCID) 10 MG tablet Take 10 mg by mouth 2 (two) times daily.  . hydrocortisone 2.5 % ointment Apply topically 2 (two) times daily.  Marland Kitchen ibuprofen (ADVIL,MOTRIN) 800 MG tablet Take 1 tablet (800 mg total) by mouth every 8 (eight) hours as needed.     Allergies  Allergen Reactions  . Tomato Anaphylaxis  . Nutritional Supplements Swelling    Pt not sure where this came frome  . Strawberry Extract Hives  . Sulfa Drugs Cross Reactors Other (See Comments)    Unknown    Social History   Socioeconomic History  . Marital status: Married    Spouse name: Not on file  . Number of children: 2  . Years of education: Not on file  . Highest education level: Not on file  Occupational History  . Occupation: Chartered loss adjuster    Comment: in [place]  . Financial resource strain: Not on file  . Food insecurity:    Worry: Not on file    Inability: Not  on file  . Transportation needs:    Medical: Not on file    Non-medical: Not on file  Tobacco Use  . Smoking status: Never Smoker  . Smokeless tobacco: Never Used  Substance and Sexual Activity  . Alcohol use: Yes    Alcohol/week: 0.0 oz    Comment: rarely - 1 to 2  . Drug use: No  . Sexual activity: Yes    Partners: Female  Lifestyle  . Physical activity:    Days per week: Not on file    Minutes per session: Not on file  . Stress: Not on file  Relationships  . Social connections:    Talks on phone: Not on file    Gets together: Not on file    Attends religious service: Not on file    Active member of club or organization: Not on file    Attends meetings of clubs or organizations: Not on file    Relationship status: Not on file  . Intimate partner violence:    Fear of current or ex partner: Not on file    Emotionally abused: Not on file    Physically abused: Not on file    Forced sexual activity: Not on file  Other Topics Concern  . Not on file  Social History Narrative   Marital status:  Married      Children: 2 children; no grandchildren.      Lives:  Lives with wife, 2 children.      Employment:  Chartered loss adjuster in [place].      Tobacco:  none      Alcohol:  Socially/weekends.      Drugs:  none      Exercise:  Exercise 3 times/week on treadmill and lift weights           Review of Systems: General: negative for chills, fever, night sweats or weight changes.  Cardiovascular: negative for chest pain, dyspnea on exertion, edema, orthopnea, palpitations, paroxysmal nocturnal dyspnea or shortness of breath Dermatological: negative for rash Respiratory: negative for cough or wheezing Urologic: negative for hematuria Abdominal: negative for nausea, vomiting, diarrhea, bright red blood per rectum, melena, or hematemesis Neurologic: negative for visual changes, syncope, or dizziness All other systems reviewed and are otherwise negative except as noted  above.    Blood pressure 124/80, pulse 90, height 5\' 8"  (1.727 m), weight (!) 322 lb 6.4 oz (146.2 kg).  General appearance: alert and no distress Neck: no adenopathy, no carotid bruit, no JVD, supple, symmetrical, trachea midline and thyroid not enlarged, symmetric, no tenderness/mass/nodules Lungs: clear to auscultation bilaterally Heart: regular rate and rhythm, S1, S2 normal, no murmur, click, rub or gallop Extremities: extremities normal, atraumatic, no cyanosis or edema Pulses: 2+ and symmetric Skin: Skin color, texture, turgor normal. No rashes or lesions Neurologic: Alert and oriented X 3, normal strength and tone. Normal symmetric reflexes. Normal coordination and gait  EKG sinus rhythm at 90 without ST or T wave changes.  I personally reviewed this EKG.  ASSESSMENT AND PLAN:   Dyspnea on exertion New onset dyspnea on exertion starting over the last several weeks.  He was on vacation in [place] and [place] and was seen in the [place] ER on 05/11/2018 with pleuritic chest pain and dyspnea.  A d-dimer was mildly elevated a CTA was negative for PE.  I am going to get a 2D echo to further evaluate.  Of note, I did perform outpatient diagnostic coronary arteriography on him 10/10/2011 which was entirely normal.  OSA (obstructive sleep apnea) She of obstructive sleep apnea in the past not wearing CPAP since 2013.  We will reevaluate.      Lorretta Harp MD FACP,FACC,FAHA, [facility] 05/26/2018 8:26 AM

## 2018-05-26 NOTE — Assessment & Plan Note (Signed)
She of obstructive sleep apnea in the past not wearing CPAP since 2013.  We will reevaluate.

## 2018-05-26 NOTE — Patient Instructions (Addendum)
Medication Instructions:  Your physician recommends that you continue on your current medications as directed. Please refer to the Current Medication list given to you today.   Labwork: none  Testing/Procedures: Your physician has requested that you have an echocardiogram. Echocardiography is a painless test that uses sound waves to create images of your heart. It provides your doctor with information about the size and shape of your heart and how well your heart's chambers and valves are working. This procedure takes approximately one hour. There are no restrictions for this procedure.  Your physician has recommended that you have a sleep study. This test records several body functions during sleep, including: brain activity, eye movement, oxygen and carbon dioxide blood levels, heart rate and rhythm, breathing rate and rhythm, the flow of air through your mouth and nose, snoring, body muscle movements, and chest and belly movement.    Follow-Up: Your physician wants you to follow-up in: 6 months with Dr. Gwenlyn Found. You will receive a reminder letter in the mail two months in advance. If you don't receive a letter, please call our office to schedule the follow-up appointment.    Any Other Special Instructions Will Be Listed Below (If Applicable).     If you need a refill on your cardiac medications before your next appointment, please call your pharmacy.

## 2018-05-28 ENCOUNTER — Telehealth: Payer: Self-pay | Admitting: *Deleted

## 2018-05-28 NOTE — Telephone Encounter (Signed)
-----   Message from Newt Minion, RN sent at 05/26/2018  8:30 AM EDT ----- Regarding: new sleep study Sleep study ordered.   K-

## 2018-05-28 NOTE — Telephone Encounter (Signed)
Called [employer] and done verbal PA request for in lab sleep study. Clinicals faxed to [phone]. Pending reference # [id number].

## 2018-06-02 ENCOUNTER — Ambulatory Visit (HOSPITAL_COMMUNITY): Payer: Federal, State, Local not specified - PPO | Attending: Cardiology

## 2018-06-02 ENCOUNTER — Other Ambulatory Visit: Payer: Self-pay

## 2018-06-02 DIAGNOSIS — R0609 Other forms of dyspnea: Secondary | ICD-10-CM | POA: Diagnosis not present

## 2018-06-02 DIAGNOSIS — R0600 Dyspnea, unspecified: Secondary | ICD-10-CM

## 2018-06-02 DIAGNOSIS — Z6841 Body Mass Index (BMI) 40.0 and over, adult: Secondary | ICD-10-CM | POA: Insufficient documentation

## 2018-06-02 MED ORDER — PERFLUTREN LIPID MICROSPHERE
1.0000 mL | INTRAVENOUS | Status: AC | PRN
  Administered 2018-06-02: 2 mL via INTRAVENOUS

## 2018-06-09 ENCOUNTER — Telehealth: Payer: Self-pay | Admitting: *Deleted

## 2018-06-09 NOTE — Telephone Encounter (Signed)
Call was received from [facility] notifying me PA was approved for patient to have in lab sleep study. Use pending reference # as approval # valid dates given are 06/26/18 to 07/26/18. Reference ID # [id number]. Patient was notified and appointment scheduled.

## 2018-06-10 DIAGNOSIS — H1012 Acute atopic conjunctivitis, left eye: Secondary | ICD-10-CM | POA: Diagnosis not present

## 2018-06-26 ENCOUNTER — Ambulatory Visit (HOSPITAL_BASED_OUTPATIENT_CLINIC_OR_DEPARTMENT_OTHER): Payer: Federal, State, Local not specified - PPO | Attending: Cardiovascular Disease | Admitting: Cardiovascular Disease

## 2018-06-26 VITALS — Ht 68.0 in | Wt 320.0 lb

## 2018-06-26 DIAGNOSIS — R0600 Dyspnea, unspecified: Secondary | ICD-10-CM

## 2018-06-26 DIAGNOSIS — Z7982 Long term (current) use of aspirin: Secondary | ICD-10-CM | POA: Insufficient documentation

## 2018-06-26 DIAGNOSIS — G4730 Sleep apnea, unspecified: Secondary | ICD-10-CM | POA: Diagnosis not present

## 2018-06-26 DIAGNOSIS — Z791 Long term (current) use of non-steroidal anti-inflammatories (NSAID): Secondary | ICD-10-CM | POA: Insufficient documentation

## 2018-06-26 DIAGNOSIS — Z7952 Long term (current) use of systemic steroids: Secondary | ICD-10-CM | POA: Insufficient documentation

## 2018-06-26 DIAGNOSIS — Z79899 Other long term (current) drug therapy: Secondary | ICD-10-CM | POA: Diagnosis not present

## 2018-06-26 DIAGNOSIS — G4733 Obstructive sleep apnea (adult) (pediatric): Secondary | ICD-10-CM | POA: Insufficient documentation

## 2018-06-26 DIAGNOSIS — R0609 Other forms of dyspnea: Secondary | ICD-10-CM

## 2018-07-06 ENCOUNTER — Telehealth: Payer: Self-pay | Admitting: Cardiovascular Disease

## 2018-07-06 NOTE — Telephone Encounter (Signed)
Called and advised patient that the results had no been read yet. He should have them soon and someone would call when they were. Patient verbalized understanding.

## 2018-07-06 NOTE — Telephone Encounter (Signed)
° °  Patient calling for sleep study results

## 2018-07-12 ENCOUNTER — Encounter (HOSPITAL_BASED_OUTPATIENT_CLINIC_OR_DEPARTMENT_OTHER): Payer: Self-pay | Admitting: Cardiovascular Disease

## 2018-07-12 NOTE — Procedures (Signed)
Patient Name: Andrew Graham, Andrew Graham Date: 06/26/2018 Gender: Male D.O.B: 01/08/1966 Age (years): 52 Referring Provider: Lorretta Harp Height (inches): 20 Interpreting Physician: Shelva Majestic MD, ABSM Weight (lbs): 320 RPSGT: Earney Hamburg BMI: 49 MRN: [id number] Neck Size: 19.50  CLINICAL INFORMATION Sleep Study Type: Split Night CPAP  Indication for sleep study: snoring; daytime sleepiness; OSA  Epworth Sleepiness Score: 21  SLEEP STUDY TECHNIQUE As per the AASM Manual for the Scoring of Sleep and Associated Events v2.3 (April 2016) with a hypopnea requiring 4% desaturations.  The channels recorded and monitored were frontal, central and occipital EEG, electrooculogram (EOG), submentalis EMG (chin), nasal and oral airflow, thoracic and abdominal wall motion, anterior tibialis EMG, snore microphone, electrocardiogram, and pulse oximetry. Continuous positive airway pressure (CPAP) was initiated when the patient met split night criteria and was titrated according to treat sleep-disordered breathing.  MEDICATIONS     albuterol (PROVENTIL HFA;VENTOLIN HFA) 108 (90 Base) MCG/ACT inhaler             aspirin 81 MG chewable tablet         benzonatate (TESSALON) 100 MG capsule         famotidine (PEPCID) 10 MG tablet         hydrocortisone 2.5 % ointment         ibuprofen (ADVIL,MOTRIN) 800 MG tablet         meloxicam (MOBIC) 15 MG tablet         methocarbamol (ROBAXIN) 750 MG tablet         predniSONE (DELTASONE) 20 MG tablet      Medications self-administered by patient taken the night of the study : N/A  RESPIRATORY PARAMETERS Diagnostic Total AHI (/hr): 33.2 RDI (/hr): 40.0 OA Index (/hr): 0.9 CA Index (/hr): 0.0 REM AHI (/hr): 3.4 NREM AHI (/hr): 37.7 Supine AHI (/hr): 80.0 Non-supine AHI (/hr): 31.5 Min O2 Sat (%): 82.0 Mean O2 (%): 93.0 Time below 88% (min): 10.6   Titration Optimal Pressure (cm): 13 AHI at Optimal Pressure (/hr): 0.8 Min O2 at Optimal  Pressure (%): 91.0 Supine % at Optimal (%): 89 Sleep % at Optimal (%): 98   SLEEP ARCHITECTURE The recording time for the entire night was 403.2 minutes.  During a baseline period of 239.1 minutes, the patient slept for 132.0 minutes in REM and nonREM, yielding a sleep efficiency of 55.2%%. Sleep onset after lights out was 10.4 minutes with a REM latency of 208.0 minutes. The patient spent 4.9%% of the night in stage N1 sleep, 81.8%% in stage N2 sleep, 0.0%% in stage N3 and 13.3% in REM.  During the titration period of 154.7 minutes, the patient slept for 122.3 minutes in REM and nonREM, yielding a sleep efficiency of 79.1%%. Sleep onset after CPAP initiation was 6.9 minutes with a REM latency of 109.5 minutes. The patient spent 2.5%% of the night in stage N1 sleep, 61.2%% in stage N2 sleep, 13.1%% in stage N3 and 23.3% in REM.  CARDIAC DATA The 2 lead EKG demonstrated sinus rhythm. The mean heart rate was 100.0 beats per minute. Other EKG findings include: None.  LEG MOVEMENT DATA The total Periodic Limb Movements of Sleep (PLMS) were 0. The PLMS index was 0.0 .  IMPRESSIONS - Severe obstructive sleep apnea occurred during the diagnostic portion of the study (AHI 33.2/hour; RDI 40.0/hour). CPAP was titrated up to an optimal PAP pressure at 13 cm of water. - No significant central sleep apnea occurred during the diagnostic portion of the  study (CAI = 0.0/hour). - Moderate oxygen desaturation during the diagnostic portion of the study to a nadir of  82.0%. - The patient snored with moderate snoring volume during the diagnostic portion of the study. - No cardiac abnormalities were noted during this study. - Clinically significant periodic limb movements did not occur during sleep.  DIAGNOSIS - Obstructive Sleep Apnea (327.23 [G47.33 ICD-10])  RECOMMENDATIONS - Recommended an initial trial of CPAP therapy with EPR/C-Flex at 13 cm H2O with heat humidification.  A Small size Fisher&Paykel Full  Face Mask Simplus mask was used for the titration. - Efforts should be made to optimize nasal and oral pharyngeal patency. - Avoid alcohol, sedatives and other CNS depressants that may worsen sleep apnea and disrupt normal sleep architecture. - Sleep hygiene should be reviewed to assess factors that may improve sleep quality. - Weight management and regular exercise should be initiated or continued. - Recommend a download be obtained in 30 days and sleep clinic evaluation after 4 weeks of therapy.  [Electronically signed] 07/12/2018 03:04 PM  Shelva Majestic MD, [facility], [place], American Board of Sleep Medicine   NPI: [id number] [place] PH: [phone]   FX: [phone] [place]

## 2018-07-15 ENCOUNTER — Telehealth: Payer: Self-pay | Admitting: Cardiovascular Disease

## 2018-07-15 NOTE — Telephone Encounter (Signed)
New Message   Pt calling to check on the results to his sleep study. Please call

## 2018-07-15 NOTE — Telephone Encounter (Signed)
Called patient and discussed sleep study results and recommendations.

## 2018-07-15 NOTE — Progress Notes (Signed)
Patient notified of results and recommendations.

## 2018-07-24 ENCOUNTER — Telehealth: Payer: Self-pay | Admitting: *Deleted

## 2018-07-24 ENCOUNTER — Other Ambulatory Visit: Payer: Self-pay | Admitting: Cardiovascular Disease

## 2018-07-24 DIAGNOSIS — G4733 Obstructive sleep apnea (adult) (pediatric): Secondary | ICD-10-CM

## 2018-07-24 NOTE — Telephone Encounter (Signed)
Left message to return a call to me. ( need to discuss CPAP order)

## 2018-07-24 NOTE — Telephone Encounter (Signed)
Patient returned a call to me. I told him that I received a call from [facility] informing me that he is requesting for his CPAP order get switched to a DME that is closer into town here, or somewhere in [place]. I told the patient that I will send a new order to [place] [facility].

## 2018-08-18 DIAGNOSIS — G4733 Obstructive sleep apnea (adult) (pediatric): Secondary | ICD-10-CM | POA: Diagnosis not present

## 2018-09-18 DIAGNOSIS — G4733 Obstructive sleep apnea (adult) (pediatric): Secondary | ICD-10-CM | POA: Diagnosis not present

## 2018-09-21 ENCOUNTER — Encounter: Payer: Self-pay | Admitting: Cardiovascular Disease

## 2018-09-28 ENCOUNTER — Ambulatory Visit: Payer: Federal, State, Local not specified - PPO | Admitting: Cardiovascular Disease

## 2018-09-28 ENCOUNTER — Encounter: Payer: Self-pay | Admitting: Cardiovascular Disease

## 2018-09-28 VITALS — BP 125/86 | HR 84 | Resp 16 | Ht 68.0 in | Wt 328.0 lb

## 2018-09-28 DIAGNOSIS — R0609 Other forms of dyspnea: Secondary | ICD-10-CM | POA: Diagnosis not present

## 2018-09-28 DIAGNOSIS — R0789 Other chest pain: Secondary | ICD-10-CM | POA: Diagnosis not present

## 2018-09-28 DIAGNOSIS — R0600 Dyspnea, unspecified: Secondary | ICD-10-CM

## 2018-09-28 DIAGNOSIS — G4730 Sleep apnea, unspecified: Secondary | ICD-10-CM

## 2018-09-28 NOTE — Patient Instructions (Signed)
Medication Instructions:  Your physician recommends that you continue on your current medications as directed. Please refer to the Current Medication list given to you today.  If you need a refill on your cardiac medications before your next appointment, please call your pharmacy.   Follow-Up: As needed with Dr. Claiborne Billings (sleep clinic)
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
## 2018-10-04 ENCOUNTER — Encounter: Payer: Self-pay | Admitting: Cardiovascular Disease

## 2018-10-18 DIAGNOSIS — G4733 Obstructive sleep apnea (adult) (pediatric): Secondary | ICD-10-CM | POA: Diagnosis not present

## 2018-11-11 DIAGNOSIS — G4733 Obstructive sleep apnea (adult) (pediatric): Secondary | ICD-10-CM | POA: Diagnosis not present

## 2018-11-15 IMAGING — MR MR KNEE*R* W/O CM
4 of 5 series · 21 of 40 positions shown · non-contrast
Comparison: None.

CLINICAL DATA: Right knee pain for 6 months.

EXAM:
MRI OF THE RIGHT KNEE WITHOUT CONTRAST
TECHNIQUE: Multiplanar, multisequence MR imaging of the knee was performed. No
intravenous contrast was administered.

[Series 3: PD fat-sat · axial · 4.0mm · 0.42mm/px · z∈[-52,+54]mm · 6 of 23 slices shown (1 of 3)]
[im 1/23]
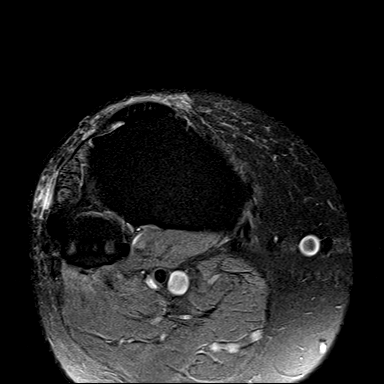
[im 5/23]
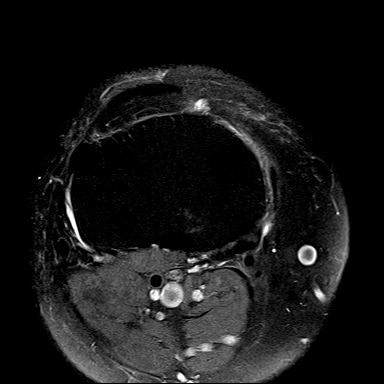
[im 9/23]
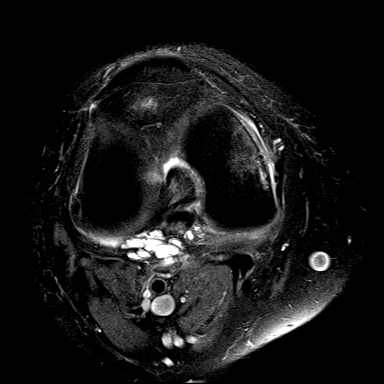
[im 14/23]
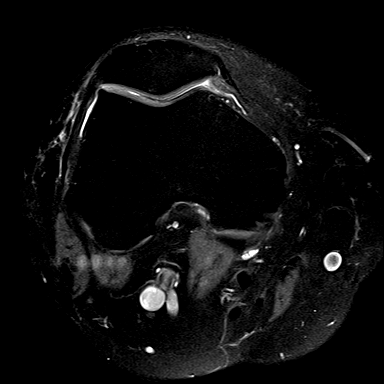
[im 18/23]
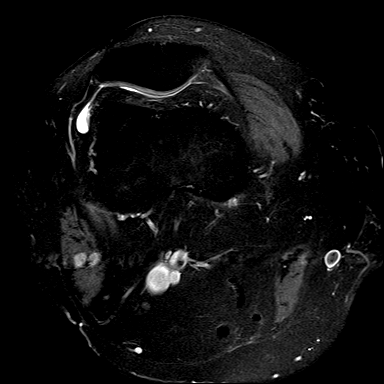
[im 23/23]
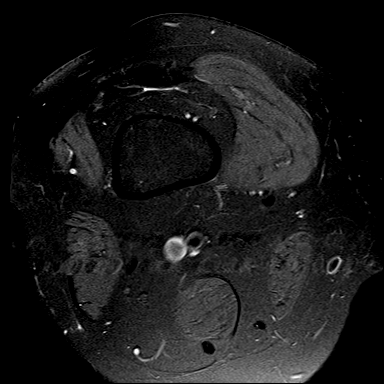

[Series 5: T2 fat-sat · coronal · 3.0mm · 0.29mm/px · 3 of 31 slices shown]
[im 4/31]
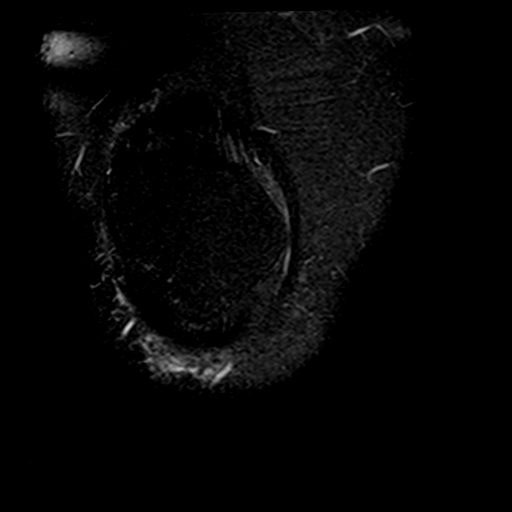
[im 16/31]
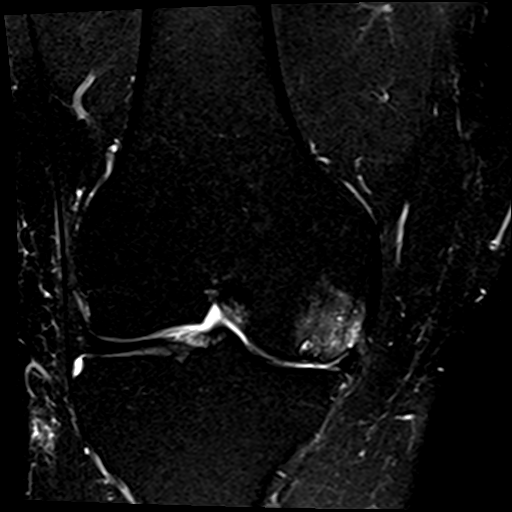
[im 27/31]
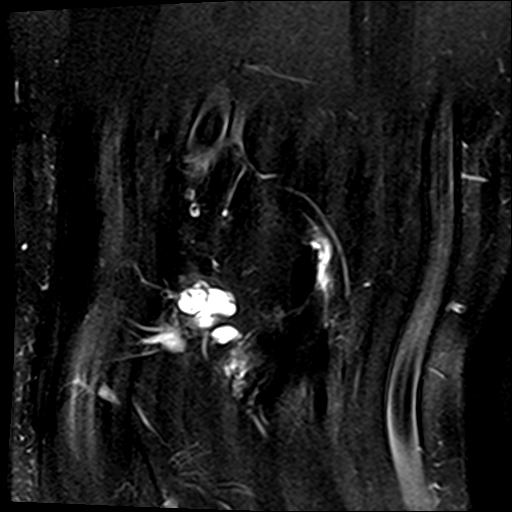

[Series 6: PD fat-sat · coronal · 3.0mm · 0.29mm/px · 9 of 31 slices shown (2 of 3)]
[im 1/31]
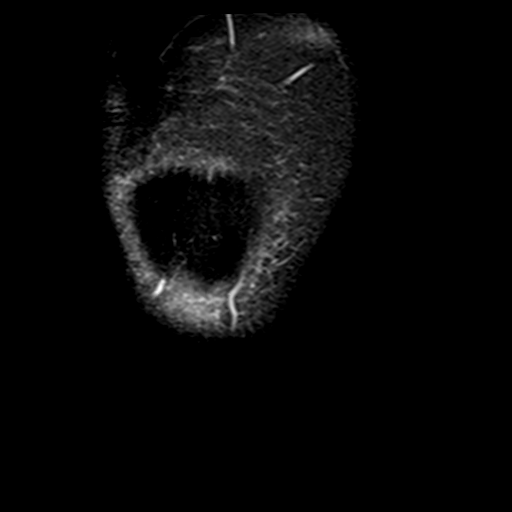
[im 4/31]
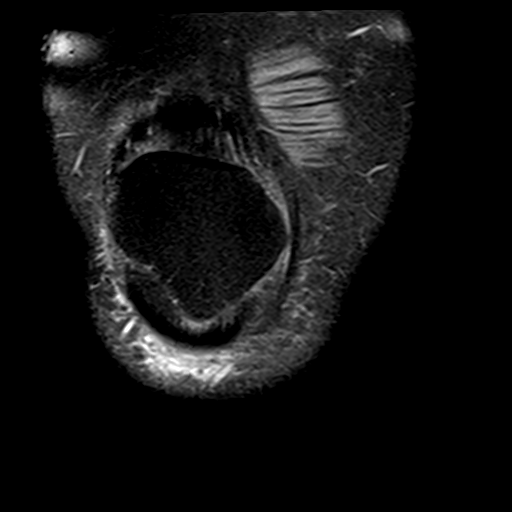
[im 8/31]
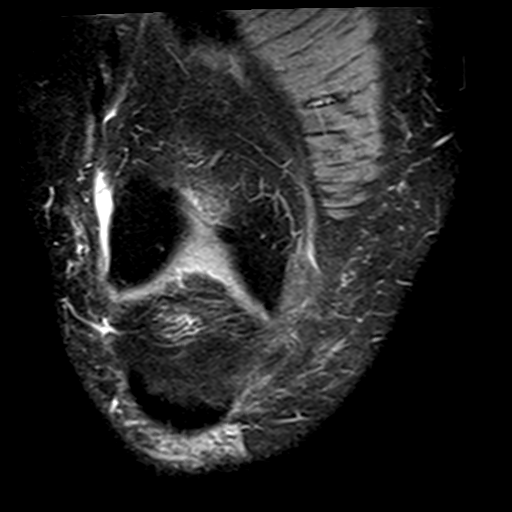
[im 12/31]
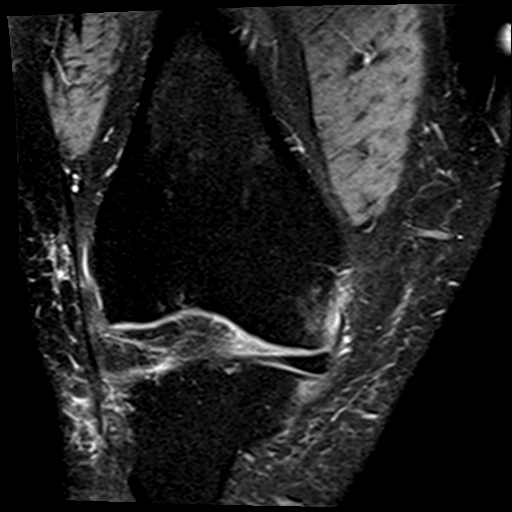
[im 16/31]
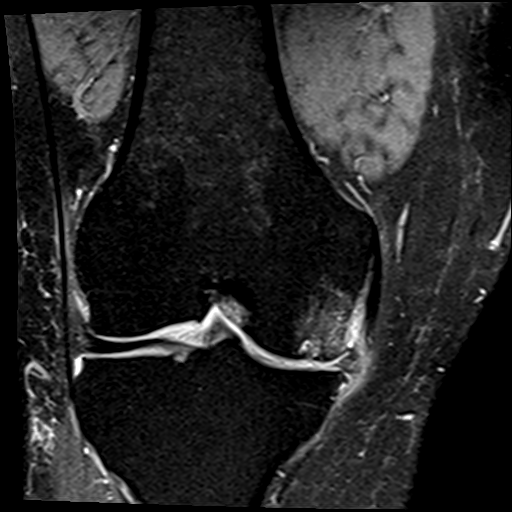
[im 19/31]
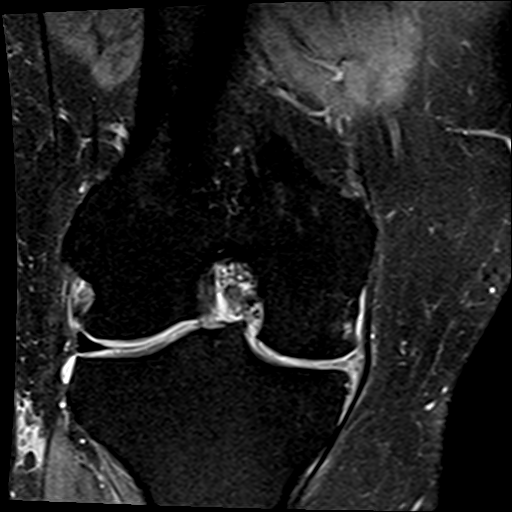
[im 23/31]
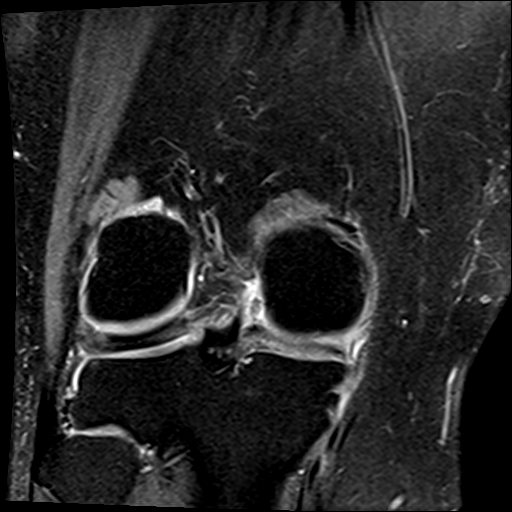
[im 27/31]
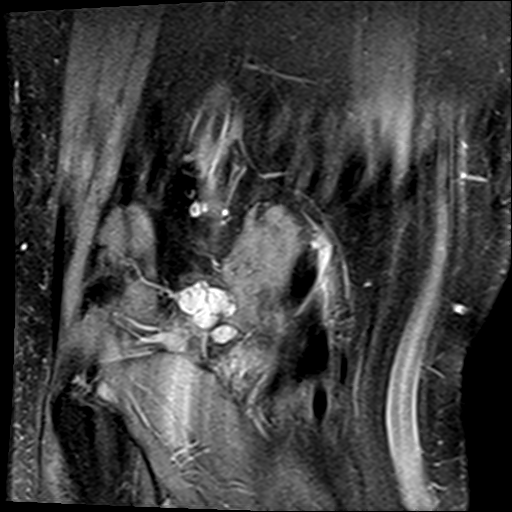
[im 31/31]
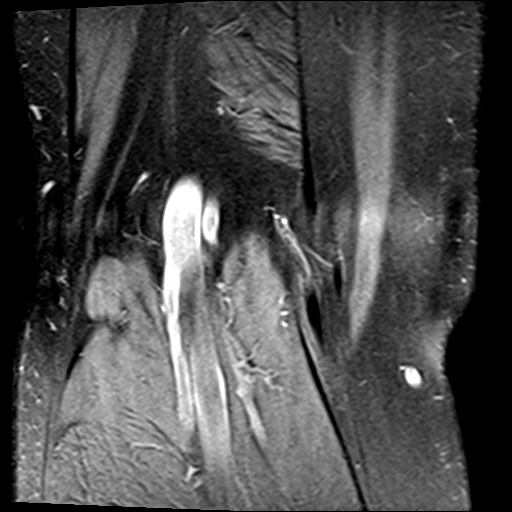

[Series 7: PD fat-sat · sagittal · 4.0mm · 0.31mm/px · 3 of 25 slices shown (3 of 3)]
[im 5/25]
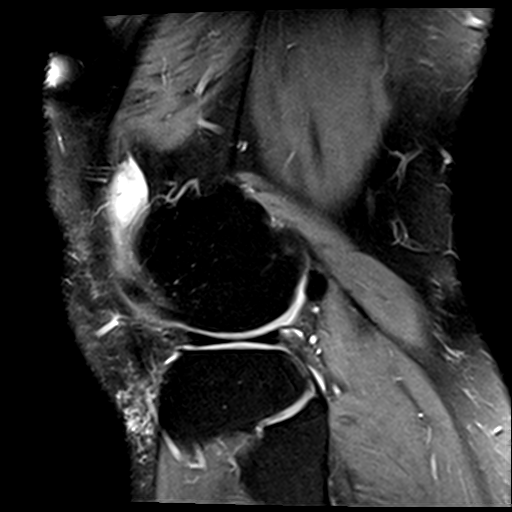
[im 13/25]
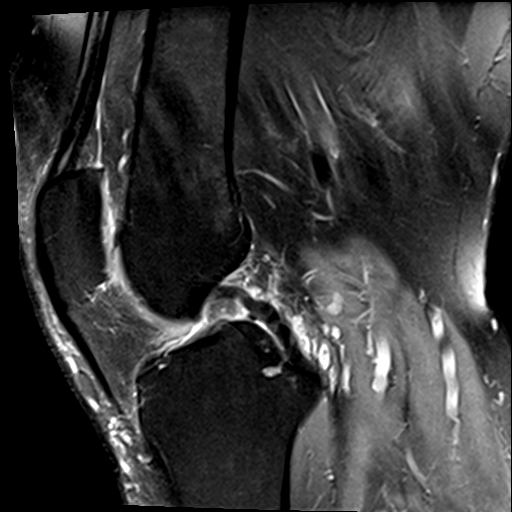
[im 21/25]
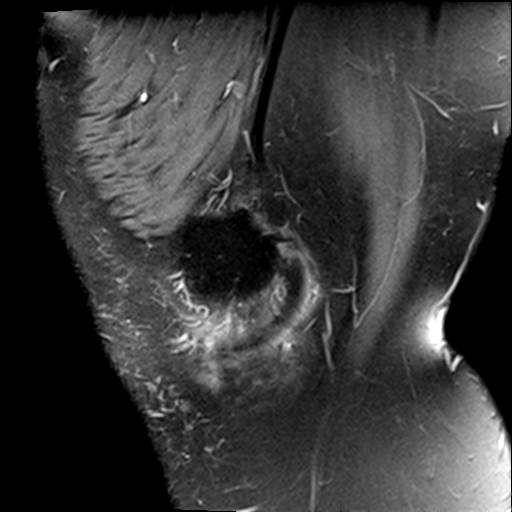

[21 of 40 positions shown; findings below may reference images not displayed]

FINDINGS: MENISCI

Medial meniscus: Severe complex tear of the body and posterior horn
of the medial meniscus with a 4 mm parameniscal cyst along the
periphery of the posterior horn-body junction.

Lateral meniscus:  Intact.

LIGAMENTS

Cruciates:  Intact ACL and PCL.

Collaterals: Medial collateral ligament is intact. Lateral
collateral ligament complex is intact.

CARTILAGE

Patellofemoral: Partial-thickness cartilage loss of the
patellofemoral compartment.

Medial: High-grade partial-thickness cartilage loss with areas of
full-thickness cartilage loss of the medial femoral condyle and
medial tibial plateau with subchondral reactive marrow edema in the
periphery of the medial femoral condyle.

Lateral:  No chondral defect.

Joint: No significant joint effusion. Mild edema in Hoffa's fat. No
plical thickening.

Popliteal Fossa: No significant Baker's cyst. Intact popliteus
tendon. Along the posterolateral joint capsule there is a
multiloculated 2.3 x 1.3 cm cystic mass most consistent with a
ganglion cyst extending to the anterior margin of popliteal artery.

Extensor Mechanism: Intact quadriceps tendon. Intact patellar
tendon. Intact medial patellar retinaculum. Intact lateral patellar
retinaculum. Intact MPFL.

Bones: No acute osseous abnormality. No aggressive osseous lesion.
No fracture or dislocation.

Other: No fluid collection or hematoma.  Muscles are normal.
IMPRESSION: 1. Severe complex tear of the body and posterior horn of the medial
meniscus with a 4 mm parameniscal cyst along the periphery of the
posterior horn-body junction.
2. High-grade partial-thickness cartilage loss with areas of
full-thickness cartilage loss of the medial femoral condyle and
medial tibial plateau with subchondral reactive marrow edema in the
periphery of the medial femoral condyle.

## 2018-11-18 DIAGNOSIS — G4733 Obstructive sleep apnea (adult) (pediatric): Secondary | ICD-10-CM | POA: Diagnosis not present

## 2018-12-18 DIAGNOSIS — G4733 Obstructive sleep apnea (adult) (pediatric): Secondary | ICD-10-CM | POA: Diagnosis not present

## 2018-12-19 DIAGNOSIS — G4733 Obstructive sleep apnea (adult) (pediatric): Secondary | ICD-10-CM | POA: Diagnosis not present

## 2018-12-21 ENCOUNTER — Telehealth: Payer: Self-pay | Admitting: Cardiovascular Disease

## 2018-12-21 ENCOUNTER — Encounter: Payer: Self-pay | Admitting: *Deleted

## 2018-12-21 NOTE — Telephone Encounter (Signed)
New Message   Pt is calling because he has a CPAP and is going to be traveling and he needs a letter stating its okay for him to fly with the CPAP  Please call

## 2018-12-21 NOTE — Telephone Encounter (Signed)
Returned a call to the patient and told him that it has been my personal experience traveling with a CPAP machine taking it as a carry on without any problems, however I will type a letter for him to have just in case he needs it. I will leave it at the front for him to pick up. Patient thanked me and states he will pick it up this afternoon.

## 2019-01-17 DIAGNOSIS — G4733 Obstructive sleep apnea (adult) (pediatric): Secondary | ICD-10-CM | POA: Diagnosis not present

## 2019-02-17 DIAGNOSIS — G4733 Obstructive sleep apnea (adult) (pediatric): Secondary | ICD-10-CM | POA: Diagnosis not present

## 2019-03-11 ENCOUNTER — Telehealth: Payer: Self-pay | Admitting: Cardiovascular Disease

## 2019-03-11 NOTE — Telephone Encounter (Signed)
Smartphone/consent/ my chart/ pre reg completed °

## 2019-03-12 ENCOUNTER — Telehealth (INDEPENDENT_AMBULATORY_CARE_PROVIDER_SITE_OTHER): Payer: Federal, State, Local not specified - PPO | Admitting: Cardiovascular Disease

## 2019-03-12 ENCOUNTER — Encounter: Payer: Self-pay | Admitting: Cardiovascular Disease

## 2019-03-12 ENCOUNTER — Telehealth: Payer: Self-pay

## 2019-03-12 VITALS — Ht 68.0 in | Wt 350.0 lb

## 2019-03-12 DIAGNOSIS — E785 Hyperlipidemia, unspecified: Secondary | ICD-10-CM

## 2019-03-12 DIAGNOSIS — G4733 Obstructive sleep apnea (adult) (pediatric): Secondary | ICD-10-CM | POA: Diagnosis not present

## 2019-03-12 DIAGNOSIS — Z13220 Encounter for screening for lipoid disorders: Secondary | ICD-10-CM

## 2019-03-12 DIAGNOSIS — Z0189 Encounter for other specified special examinations: Secondary | ICD-10-CM

## 2019-03-12 NOTE — Patient Instructions (Addendum)
Medication Instructions:  Your physician recommends that you continue on your current medications as directed. Please refer to the Current Medication list given to you today.  If you need a refill on your cardiac medications before your next appointment, please call your pharmacy.   Lab work: Your physician recommends that you return for lab work: BASIC METABOLIC PANEL, LIPID PANEL, LIVER FUNCTION TEST, THYROID FUNCTION PANEL, PROSTATE-SPECIFIC ANTIGEN TEST (PSA).  YOU WILL RECEIVE A LAB SLIP IN THE MAIL. SOME OF THESE LABS ARE "FASTING" SO PLEASE DO NOT EAT OR DRINK (EXCEPT WATER) ANYTHING AFTER MIDNIGHT ON THE DAY YOU CHOOSE TO PRESENT FOR LAB WORK. YOU MAY EAT AFTER YOUR BLOOD HAS BEEN COLLECTED. NO APPOINTMENT IS NEEDED.  If you have labs (blood work) drawn today and your tests are completely normal, you will receive your results only by: Marland Kitchen MyChart Message (if you have MyChart) OR . A paper copy in the mail If you have any lab test that is abnormal or we need to change your treatment, we will call you to review the results.  Testing/Procedures: NONE  Follow-Up: At [facility], you and your health needs are our priority.  As part of our continuing mission to provide you with exceptional heart care, we have created designated Provider Care Teams.  These Care Teams include your primary Cardiologist (physician) and Advanced Practice Providers (APPs -  Physician Assistants and Nurse Practitioners) who all work together to provide you with the care you need, when you need it. You will need a follow up appointment in 12 months WITH DR. Gwenlyn Found.  Please call our office 2 months in advance to schedule this appointment.

## 2019-03-12 NOTE — Telephone Encounter (Signed)
Patient and/or DPR-approved person aware of AVS instructions and verbalized understanding. Letter including After Visit Summary and any other necessary documents to be mailed to the patient's address on file.  

## 2019-03-12 NOTE — Progress Notes (Signed)
Virtual Visit via Video Note   This visit type was conducted due to national recommendations for restrictions regarding the COVID-19 Pandemic (e.g. social distancing) in an effort to limit this patient's exposure and mitigate transmission in our community.  Due to his co-morbid illnesses, this patient is at least at moderate risk for complications without adequate follow up.  This format is felt to be most appropriate for this patient at this time.  All issues noted in this document were discussed and addressed.  A limited physical exam was performed with this format.  Please refer to the patient's chart for his consent to telehealth for [facility].   Date:  03/12/2019   ID:  Andrew Graham, DOB May 14, 1966, MRN [id number]  Patient Location: Other:  His place of work Provider Location: Home  PCP:  Wardell Honour, MD  Cardiologist: Dr. Quay Burow Electrophysiologist:  None   Evaluation Performed:  Follow-Up Visit  Chief Complaint: 1 year follow-up  History of Present Illness:    Andrew Graham is a 53 y.o. morbidly overweight married Caucasian male father of 2, grandfather of 73 young grandchild referred by the ER for evaluation of dyspnea.    I last saw him in the office 05/26/2018.  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  He does not smoke, he drinks socially and has otherwise no other risk factors.  I did perform a cardiac catheterization on him as an outpatient because of chest pain 10/10/2011 which was entirely normal.  He is done well since until he went on vacation to [place] in late June.  He came back feeling fatigued with pleuritic chest pain.  He was [facility] where a d-dimer was mildly elevated and a CTA was negative.  His work-up otherwise was unremarkable.  He has gotten progressively better over the last 2 weeks.  Since I saw him a year ago he is remained stable.  He is completely asymptomatic.  He did see Dr.  Claiborne Billings for evaluation of obstructive sleep apnea and currently wears CPAP which he greatly benefits from.  He walks 3 to 7 miles a day at work without limitation specifically denying chest pain or shortness of breath.  He has not seen his PCP in several years and therefore has no recent lab work. The patient does not have symptoms concerning for COVID-19 infection (fever, chills, cough, or new shortness of breath).    Past Medical History:  Diagnosis Date  . Allergy   . Arthritis   . Glucose intolerance (impaired glucose tolerance)   . Hypogonadism in male   . Obstructive sleep apnea    Past Surgical History:  Procedure Laterality Date  . LEFT HEART CATHETERIZATION WITH CORONARY ANGIOGRAM N/A 10/10/2011   Procedure: LEFT HEART CATHETERIZATION WITH CORONARY ANGIOGRAM;  Surgeon: Lorretta Harp, MD;  Location: [facility] CATH LAB;  Service: Cardiovascular;  Laterality: N/A;  . VASECTOMY  1998     Current Meds  Medication Sig  . albuterol (PROVENTIL HFA;VENTOLIN HFA) 108 (90 Base) MCG/ACT inhaler Inhale 2 puffs into the lungs every 4 (four) hours as needed for wheezing or shortness of breath (cough, shortness of breath or wheezing.).  Marland Kitchen aspirin 81 MG chewable tablet Chew 81 mg by mouth daily.  . famotidine (PEPCID) 10 MG tablet Take 10 mg by mouth 2 (two) times daily.  . hydrocortisone 2.5 % ointment Apply topically 2 (two) times daily.  Marland Kitchen ibuprofen (ADVIL,MOTRIN) 800 MG tablet  Take 1 tablet (800 mg total) by mouth every 8 (eight) hours as needed.     Allergies:   Tomato; Nutritional supplements; Strawberry extract; and Sulfa drugs cross reactors   Social History   Tobacco Use  . Smoking status: Never Smoker  . Smokeless tobacco: Never Used  Substance Use Topics  . Alcohol use: Yes    Alcohol/week: 0.0 standard drinks    Comment: rarely - 1 to 2  . Drug use: No     Family Hx: The patient's family history includes Cancer in his maternal grandmother; Obesity in his mother; Parkinson's  disease in his mother and unknown relative.  ROS:   Please see the history of present illness.     All other systems reviewed and are negative.   Prior CV studies:   The following studies were reviewed today:  None  Labs/Other Tests and Data Reviewed:    EKG:  No ECG reviewed.  Recent Labs: 05/11/2018: B Natriuretic Peptide 17.0; BUN 10; Creatinine, Ser 0.62; Hemoglobin 13.4; Platelets 202; Potassium 4.1; Sodium 135   Recent Lipid Panel Lab Results  Component Value Date/Time   CHOL 148 01/11/2015 08:42 AM   TRIG 174 (H) 01/11/2015 08:42 AM   HDL 30 (L) 01/11/2015 08:42 AM   CHOLHDL 4.9 01/11/2015 08:42 AM   LDLCALC 83 01/11/2015 08:42 AM    Wt Readings from Last 3 Encounters:  03/12/19 (!) 350 lb (158.8 kg)  09/28/18 (!) 328 lb (148.8 kg)  06/26/18 (!) 320 lb (145.2 kg)     Objective:    Vital Signs:  Ht 5\' 8"  (1.727 m)   Wt (!) 350 lb (158.8 kg)   BMI 53.22 kg/m    VITAL SIGNS:  reviewed GEN:  no acute distress RESPIRATORY:  normal respiratory effort, symmetric expansion NEURO:  alert and oriented x 3, no obvious focal deficit PSYCH:  normal affect  ASSESSMENT & PLAN:    1. Obstructive sleep apnea- on CPAP which he benefits from 2. Hyperlipidemia- not on any medications.  No recent labs in the system.  We will check fasting lipid liver profile.  COVID-19 Education: The signs and symptoms of COVID-19 were discussed with the patient and how to seek care for testing (follow up with PCP or arrange E-visit).  The importance of social distancing was discussed today.  Time:   Today, I have spent 9 minutes with the patient with telehealth technology discussing the above problems.     Medication Adjustments/Labs and Tests Ordered: Current medicines are reviewed at length with the patient today.  Concerns regarding medicines are outlined above.   Tests Ordered: No orders of the defined types were placed in this encounter.   Medication Changes: No orders of  the defined types were placed in this encounter.   Disposition:  Follow up in 1 year(s)  Signed, Quay Burow, MD  03/12/2019 8:54 AM    [facility]

## 2019-03-19 DIAGNOSIS — G4733 Obstructive sleep apnea (adult) (pediatric): Secondary | ICD-10-CM | POA: Diagnosis not present

## 2019-04-13 DIAGNOSIS — H1012 Acute atopic conjunctivitis, left eye: Secondary | ICD-10-CM | POA: Diagnosis not present

## 2019-04-19 DIAGNOSIS — G4733 Obstructive sleep apnea (adult) (pediatric): Secondary | ICD-10-CM | POA: Diagnosis not present

## 2019-05-19 DIAGNOSIS — G4733 Obstructive sleep apnea (adult) (pediatric): Secondary | ICD-10-CM | POA: Diagnosis not present

## 2019-11-06 DIAGNOSIS — K047 Periapical abscess without sinus: Secondary | ICD-10-CM | POA: Diagnosis not present

## 2019-11-06 DIAGNOSIS — L03811 Cellulitis of head [any part, except face]: Secondary | ICD-10-CM | POA: Diagnosis not present

## 2019-11-06 DIAGNOSIS — H1089 Other conjunctivitis: Secondary | ICD-10-CM | POA: Diagnosis not present

## 2019-11-09 ENCOUNTER — Emergency Department
Admission: EM | Admit: 2019-11-09 | Discharge: 2019-11-09 | Disposition: A | Discharge status: Home / Self Care | Payer: Federal, State, Local not specified - PPO | Attending: Emergency Medicine | Admitting: Emergency Medicine

## 2019-11-09 ENCOUNTER — Other Ambulatory Visit: Payer: Self-pay

## 2019-11-09 DIAGNOSIS — B958 Unspecified staphylococcus as the cause of diseases classified elsewhere: Secondary | ICD-10-CM | POA: Diagnosis not present

## 2019-11-09 DIAGNOSIS — L089 Local infection of the skin and subcutaneous tissue, unspecified: Secondary | ICD-10-CM | POA: Diagnosis not present

## 2019-11-09 DIAGNOSIS — L0889 Other specified local infections of the skin and subcutaneous tissue: Secondary | ICD-10-CM | POA: Insufficient documentation

## 2019-11-09 DIAGNOSIS — A499 Bacterial infection, unspecified: Secondary | ICD-10-CM | POA: Insufficient documentation

## 2019-11-09 DIAGNOSIS — Z7982 Long term (current) use of aspirin: Secondary | ICD-10-CM | POA: Diagnosis not present

## 2019-11-09 DIAGNOSIS — Z79899 Other long term (current) drug therapy: Secondary | ICD-10-CM | POA: Insufficient documentation

## 2019-11-09 DIAGNOSIS — R21 Rash and other nonspecific skin eruption: Secondary | ICD-10-CM | POA: Diagnosis not present

## 2019-11-09 MED ORDER — CLINDAMYCIN HCL 300 MG PO CAPS: 300.0000 mg | ORAL_CAPSULE | Freq: Three times a day (TID) | ORAL | 0 refills | Status: AC

## 2019-11-09 MED ORDER — PREDNISONE 10 MG (21) PO TBPK: ORAL_TABLET | ORAL | 0 refills | Status: DC

## 2019-11-09 MED ORDER — CLINDAMYCIN HCL 150 MG PO CAPS
300.0000 mg | ORAL_CAPSULE | Freq: Once | ORAL | Status: AC
  Administered 2019-11-09: 300 mg via ORAL
  Filled 2019-11-09: qty 2

## 2019-11-09 MED ORDER — PREDNISONE 20 MG PO TABS
60.0000 mg | ORAL_TABLET | Freq: Once | ORAL | Status: AC
  Administered 2019-11-09: 60 mg via ORAL
  Filled 2019-11-09: qty 3

## 2019-11-09 NOTE — ED Provider Notes (Signed)
[facility] Emergency Department Provider Note  ____________________________________________  Time seen: Approximately 11:19 PM  I have reviewed the triage vital signs and the nursing notes.   HISTORY  Chief Complaint Rash   HPI Andrew Graham is a 53 y.o. male who presents to the emergency department for treatment and evaluation of facial erythema and area of swelling on his forehead. Symptoms started 3-4 days ago. He was evaluated at [facility] and given Keflex and Triamcinolone. Symptoms seem worse.   Past Medical History:  Diagnosis Date  . Allergy   . Arthritis   . Glucose intolerance (impaired glucose tolerance)   . Hypogonadism in male   . Obstructive sleep apnea     Patient Active Problem List   Diagnosis Date Noted  . Dyspnea on exertion 05/26/2018  . Morbid obesity ([place]) 01/12/2015  . Glucose intolerance (impaired glucose tolerance) 01/12/2015  . OSA (obstructive sleep apnea) 01/12/2015  . Dyslipidemia 01/12/2015    Past Surgical History:  Procedure Laterality Date  . LEFT HEART CATHETERIZATION WITH CORONARY ANGIOGRAM N/A 10/10/2011   Procedure: LEFT HEART CATHETERIZATION WITH CORONARY ANGIOGRAM;  Surgeon: Lorretta Harp, MD;  Location: [facility] CATH LAB;  Service: Cardiovascular;  Laterality: N/A;  . VASECTOMY  1998    Prior to Admission medications   Medication Sig Start Date End Date Taking? Authorizing Provider  albuterol (PROVENTIL HFA;VENTOLIN HFA) 108 (90 Base) MCG/ACT inhaler Inhale 2 puffs into the lungs every 4 (four) hours as needed for wheezing or shortness of breath (cough, shortness of breath or wheezing.). 05/15/17   Posey Boyer, MD  aspirin 81 MG chewable tablet Chew 81 mg by mouth daily.    [provider]  clindamycin (CLEOCIN) 300 MG capsule Take 1 capsule (300 mg total) by mouth 3 (three) times daily for 10 days. 11/09/19 11/19/19  Wilsie Kern, Johnette Abraham B, FNP  famotidine (PEPCID) 10 MG tablet Take 10 mg by mouth 2 (two)  times daily.    [provider]  hydrocortisone 2.5 % ointment Apply topically 2 (two) times daily. 01/11/15   Wardell Honour, MD  ibuprofen (ADVIL,MOTRIN) 800 MG tablet Take 1 tablet (800 mg total) by mouth every 8 (eight) hours as needed. 02/02/17   Rudene Re, MD  predniSONE (STERAPRED UNI-PAK 21 TAB) 10 MG (21) TBPK tablet Take 6 tablets on the first day and decrease by 1 tablet each day until finished. 11/09/19   Keilen Kahl, Johnette Abraham B, FNP    Allergies Tomato, Nutritional supplements, Strawberry extract, and Sulfa drugs cross reactors  Family History  Problem Relation Age of Onset  . Cancer Maternal Grandmother        throat  . Parkinson's disease Mother   . Obesity Mother   . Parkinson's disease Other     Social History Social History   Tobacco Use  . Smoking status: Never Smoker  . Smokeless tobacco: Never Used  Substance Use Topics  . Alcohol use: Yes    Alcohol/week: 0.0 standard drinks    Comment: rarely - 1 to 2  . Drug use: No    Review of Systems  Constitutional: Negative for fever. Respiratory: Negative for cough or shortness of breath.  Musculoskeletal: Negative for myalgias Skin: Positive for forehead erythema and edema. Neurological: Negative for numbness or paresthesias. ____________________________________________   PHYSICAL EXAM:  VITAL SIGNS: ED Triage Vitals  Enc Vitals Group     BP 11/09/19 2159 (!) 157/97     Pulse Rate 11/09/19 2159 (!) 103  Resp 11/09/19 2159 18     Temp 11/09/19 2159 98 F (36.7 C)     Temp Source 11/09/19 2159 Oral     SpO2 11/09/19 2159 100 %     Weight 11/09/19 2201 (!) 340 lb (154.2 kg)     Height 11/09/19 2201 5\' 8"  (1.727 m)     Head Circumference --      Peak Flow --      Pain Score 11/09/19 2200 0     Pain Loc --      Pain Edu? --      Excl. in [place]? --      Constitutional: Overall well appearing. Eyes: Conjunctivae are clear without discharge or drainage. Nose: No rhinorrhea  noted. Mouth/Throat: Airway is patent.  Neck: No stridor. Unrestricted range of motion observed. Cardiovascular: Capillary refill is <3 seconds.  Respiratory: Respirations are even and unlabored.. Musculoskeletal: Unrestricted range of motion observed. Neurologic: Awake, alert, and oriented x 4.  Skin:  Skin erythema and edema over the forehead, frontal scalp, and left temple area with linear abrasion.  ____________________________________________   LABS (all labs ordered are listed, but only abnormal results are displayed)  Labs Reviewed - No data to display ____________________________________________  EKG  Not indicated. ____________________________________________  RADIOLOGY  Not indicated. ____________________________________________   PROCEDURES  Procedures ____________________________________________   INITIAL IMPRESSION / ASSESSMENT AND PLAN / ED COURSE  [name] is a 53 y.o. male presents to the emergency department for treatment and evaluation of facial erythema and swelling. See HPI for further details.   Exam concerning for staph infection/cellulitis. He was advised to stop the Keflex. He is allergic to Bactrim so will be treated with Clindamycin. He will also be given prednisone. He is to follow up with primary care or return to the ER for symptoms that change or worsen or for new concerns.   Medications  clindamycin (CLEOCIN) capsule 300 mg (has no administration in time range)  predniSONE (DELTASONE) tablet 60 mg (has no administration in time range)     Pertinent labs & imaging results that were available during my care of the patient were reviewed by me and considered in my medical decision making (see chart for details).  ____________________________________________   FINAL CLINICAL IMPRESSION(S) / ED DIAGNOSES  Final diagnoses:  Staph skin infection    ED Discharge Orders         Ordered    clindamycin (CLEOCIN) 300 MG capsule  3  times daily     11/09/19 2315    predniSONE (STERAPRED UNI-PAK 21 TAB) 10 MG (21) TBPK tablet     11/09/19 2315           Note:  This document was prepared using Dragon voice recognition software and may include unintentional dictation errors.   Victorino Dike, FNP 11/09/19 2329    Harvest Dark, MD 11/09/19 [phone]

## 2019-11-09 NOTE — Discharge Instructions (Signed)
Please see your primary care provider if you are not improving over the next 2 days. If the redness gets worse or you develop a fever and you are unable to schedule an appointment, please return to the ER. Take the antibiotic and prednisone as prescribed and until finished.

## 2019-11-09 NOTE — ED Triage Notes (Signed)
Pt to the er for an area of swelling over the forehead. PT has two areas that look like a rash or possible swelling to the center of the forehead and the left temple. Pt denies any new exposure to anything. Pt does reports itching. [facility] and was given triamcinolone, and keflex. Pt has no improvement.

## 2019-11-23 DIAGNOSIS — G4733 Obstructive sleep apnea (adult) (pediatric): Secondary | ICD-10-CM | POA: Diagnosis not present

## 2020-01-02 ENCOUNTER — Emergency Department: Payer: Federal, State, Local not specified - PPO

## 2020-01-02 ENCOUNTER — Emergency Department
Admission: EM | Admit: 2020-01-02 | Discharge: 2020-01-02 | Disposition: A | Discharge status: Home / Self Care | Payer: Federal, State, Local not specified - PPO | Attending: Emergency Medicine | Admitting: Emergency Medicine

## 2020-01-02 ENCOUNTER — Other Ambulatory Visit: Payer: Self-pay

## 2020-01-02 ENCOUNTER — Encounter: Payer: Self-pay | Admitting: Emergency Medicine

## 2020-01-02 DIAGNOSIS — X58XXXS Exposure to other specified factors, sequela: Secondary | ICD-10-CM | POA: Diagnosis not present

## 2020-01-02 DIAGNOSIS — M25561 Pain in right knee: Secondary | ICD-10-CM

## 2020-01-02 DIAGNOSIS — M1711 Unilateral primary osteoarthritis, right knee: Secondary | ICD-10-CM | POA: Diagnosis not present

## 2020-01-02 DIAGNOSIS — Z7982 Long term (current) use of aspirin: Secondary | ICD-10-CM | POA: Diagnosis not present

## 2020-01-02 DIAGNOSIS — Z79899 Other long term (current) drug therapy: Secondary | ICD-10-CM | POA: Insufficient documentation

## 2020-01-02 DIAGNOSIS — S83271S Complex tear of lateral meniscus, current injury, right knee, sequela: Secondary | ICD-10-CM | POA: Insufficient documentation

## 2020-01-02 DIAGNOSIS — M25461 Effusion, right knee: Secondary | ICD-10-CM | POA: Diagnosis not present

## 2020-01-02 DIAGNOSIS — M23206 Derangement of unspecified meniscus due to old tear or injury, right knee: Secondary | ICD-10-CM | POA: Diagnosis not present

## 2020-01-02 DIAGNOSIS — M23203 Derangement of unspecified medial meniscus due to old tear or injury, right knee: Secondary | ICD-10-CM

## 2020-01-02 MED ORDER — LIDOCAINE HCL (PF) 1 % IJ SOLN
5.0000 mL | Freq: Once | INTRAMUSCULAR | Status: DC
  Filled 2020-01-02: qty 5

## 2020-01-02 MED ORDER — TRIAMCINOLONE ACETONIDE 40 MG/ML IJ SUSP
40.0000 mg | Freq: Once | INTRAMUSCULAR | Status: AC
  Administered 2020-01-02: 40 mg via INTRA_ARTICULAR
  Filled 2020-01-02: qty 1

## 2020-01-02 MED ORDER — BUPIVACAINE HCL 0.5 % IJ SOLN
5.0000 mL | Freq: Once | INTRAMUSCULAR | Status: AC
  Administered 2020-01-02: 5 mL via INTRA_ARTICULAR
  Filled 2020-01-02: qty 5

## 2020-01-02 NOTE — ED Notes (Signed)
Pt states he stepped wrong on his R knee and heard a loud pop on Friday- pt states when he moves that knee in certain ways it has the same sharp pain- R knee is swollen

## 2020-01-02 NOTE — Discharge Instructions (Signed)
Please rest ice and elevate the right knee.  He may take Tylenol for additional pain relief.  Purchase compression sleeve to help prevent further swelling.  Follow-up with orthopedics to discuss further treatment options if pain persist.

## 2020-01-02 NOTE — ED Provider Notes (Signed)
[facility] EMERGENCY DEPARTMENT Provider Note   CSN: [id number] Arrival date & time: 01/02/20  1043     History Chief Complaint  Patient presents with  . Knee Pain    Andrew Graham is a 54 y.o. male presents to the emergency department for evaluation of right knee pain.  Patient states Friday stepped on the knee wrong, felt a pop, having lateral and medial knee pain.  History of complex medial meniscus tear with severe cartilage loss in the medial compartment based on MRI report from 2018.  He has had intermittent knee pain responding well to cortisone injections every 6 months.  Having a hard time ambulating now, describing sharp pain, tightness throughout the right knee.  No other injury to his body.  HPI     Past Medical History:  Diagnosis Date  . Allergy   . Arthritis   . Glucose intolerance (impaired glucose tolerance)   . Hypogonadism in male   . Obstructive sleep apnea     Patient Active Problem List   Diagnosis Date Noted  . Dyspnea on exertion 05/26/2018  . Morbid obesity ([place]) 01/12/2015  . Glucose intolerance (impaired glucose tolerance) 01/12/2015  . OSA (obstructive sleep apnea) 01/12/2015  . Dyslipidemia 01/12/2015    Past Surgical History:  Procedure Laterality Date  . LEFT HEART CATHETERIZATION WITH CORONARY ANGIOGRAM N/A 10/10/2011   Procedure: LEFT HEART CATHETERIZATION WITH CORONARY ANGIOGRAM;  Surgeon: Lorretta Harp, MD;  Location: [facility] CATH LAB;  Service: Cardiovascular;  Laterality: N/A;  . VASECTOMY  1998       Family History  Problem Relation Age of Onset  . Cancer Maternal Grandmother        throat  . Parkinson's disease Mother   . Obesity Mother   . Parkinson's disease Other     Social History   Tobacco Use  . Smoking status: Never Smoker  . Smokeless tobacco: Never Used  Substance Use Topics  . Alcohol use: Yes    Alcohol/week: 0.0 standard drinks    Comment: rarely - 1 to 2  . Drug use: No     Home Medications Prior to Admission medications   Medication Sig Start Date End Date Taking? Authorizing Provider  albuterol (PROVENTIL HFA;VENTOLIN HFA) 108 (90 Base) MCG/ACT inhaler Inhale 2 puffs into the lungs every 4 (four) hours as needed for wheezing or shortness of breath (cough, shortness of breath or wheezing.). 05/15/17   Posey Boyer, MD  aspirin 81 MG chewable tablet Chew 81 mg by mouth daily.    [provider]  famotidine (PEPCID) 10 MG tablet Take 10 mg by mouth 2 (two) times daily.    [provider]  hydrocortisone 2.5 % ointment Apply topically 2 (two) times daily. 01/11/15   Wardell Honour, MD  ibuprofen (ADVIL,MOTRIN) 800 MG tablet Take 1 tablet (800 mg total) by mouth every 8 (eight) hours as needed. 02/02/17   Rudene Re, MD  predniSONE (STERAPRED UNI-PAK 21 TAB) 10 MG (21) TBPK tablet Take 6 tablets on the first day and decrease by 1 tablet each day until finished. 11/09/19   Triplett, Johnette Abraham B, FNP    Allergies    Tomato, Nutritional supplements, Strawberry extract, and Sulfa drugs cross reactors  Review of Systems   Review of Systems  Respiratory: Negative for shortness of breath.   Cardiovascular: Negative for chest pain.  Musculoskeletal: Positive for arthralgias, gait problem and joint swelling. Negative for myalgias and neck pain.  Skin: Negative for  rash and wound.    Physical Exam Updated Vital Signs BP (!) 141/76 (BP Location: Right Arm)   Pulse 91   Temp 97.6 F (36.4 C) (Oral)   Resp 20   Ht 5\' 9"  (1.753 m)   Wt (!) 154.2 kg   SpO2 99%   BMI 50.21 kg/m   Physical Exam Constitutional:      Appearance: He is well-developed.  HENT:     Head: Normocephalic and atraumatic.  Eyes:     Conjunctiva/sclera: Conjunctivae normal.  Cardiovascular:     Rate and Rhythm: Normal rate.  Pulmonary:     Effort: Pulmonary effort is normal. No respiratory distress.  Musculoskeletal:        General: Normal range of motion.      Cervical back: Normal range of motion.     Comments: Right knee with normal active extension.  Patella tracks well.  Nontender to palpation.  Mild effusion noted.  Range of motion with flexion slightly limited on the right when compared to the left, 0 to 90 degrees.  Hip internal X rotation well.  No swelling warmth erythema or edema throughout the lower leg.  No skin breakdown noted.  Skin:    General: Skin is warm.     Findings: No rash.  Neurological:     Mental Status: He is alert and oriented to person, place, and time.  Psychiatric:        Behavior: Behavior normal.        Thought Content: Thought content normal.     ED Results / Procedures / Treatments   Labs (all labs ordered are listed, but only abnormal results are displayed) Labs Reviewed - No data to display  EKG None  Radiology DG Knee Complete 4 Views Right  Result Date: 01/02/2020 CLINICAL DATA:  Lateral right knee pain and swelling for 2 days since the patient took a wrong step and heard a pop in the knee. Initial encounter. EXAM: RIGHT KNEE - COMPLETE 4+ VIEW COMPARISON:  Plain films of the right knee 11/14/2014. FINDINGS: There is no acute bony or joint abnormality. No joint effusion. Degenerative disease about the knee is most notable in the medial compartment. No chondrocalcinosis. IMPRESSION: No acute abnormality. Medial compartment degenerative disease has progressed since the prior exam. Electronically Signed   By: Inge Rise M.D.   On: 01/02/2020 12:35    Procedures .Joint Aspiration/Arthrocentesis  Date/Time: 01/02/2020 1:47 PM Performed by: Duanne Guess, PA-C Authorized by: Duanne Guess, PA-C   Consent:    Consent obtained:  Verbal   Consent given by:  Patient   Alternatives discussed:  No treatment Location:    Location:  Knee   Knee:  R knee Anesthesia (see MAR for exact dosages):    Anesthesia method:  Topical application and local infiltration   Local anesthetic:  Lidocaine 1% w/o  epi Procedure details:    Needle gauge:  22 G   Approach:  Superior   Aspirate amount:  15   Aspirate characteristics:  Blood-tinged   Steroid injected: yes     Specimen collected: no   Post-procedure details:    Dressing:  Adhesive bandage   Patient tolerance of procedure:  Tolerated well, no immediate complications   (including critical care time)  Medications Ordered in ED Medications  triamcinolone acetonide (KENALOG-40) injection 40 mg (has no administration in time range)  lidocaine (PF) (XYLOCAINE) 1 % injection 5 mL (has no administration in time range)  bupivacaine (MARCAINE) 0.5 % (  with pres) injection 5 mL (has no administration in time range)    ED Course  I have reviewed the triage vital signs and the nursing notes.  Pertinent labs & imaging results that were available during my care of the patient were reviewed by me and considered in my medical decision making (see chart for details).    MDM Rules/Calculators/A&P                      54 year old male with acute right knee pain, effusion.  No ligamentous laxity on exam today.  Previous MRI shows complex medial meniscus tear with significant cartilage loss in the medial compartment.  X-rays today show no evidence of acute bony abnormality.  Patient agreed and consented to a right knee aspiration injection of cortisone.  He will rest ice and elevate the knee take it easy for couple days and follow-up with orthopedics if no improvement.     Final Clinical Impression(s) / ED Diagnoses Final diagnoses:  Acute pain of right knee  Effusion of right knee  Primary osteoarthritis of right knee  Old complex tear of medial meniscus of right knee    Rx / DC Orders ED Discharge Orders    None       Renata Caprice 01/02/20 1349    Carrie Mew, MD 01/02/20 1534

## 2020-01-02 NOTE — ED Triage Notes (Signed)
Pt ambulatory to triage states he stepped wrong on Friday and is now having pain in his right knee.

## 2020-01-02 NOTE — ED Notes (Signed)
Meds in tube station received from pharmacy- bupivicaine given to provider.

## 2020-04-04 ENCOUNTER — Telehealth: Payer: Self-pay | Admitting: Cardiovascular Disease

## 2020-04-04 NOTE — Telephone Encounter (Signed)
Patient states his PCP moved and he would like a recommendation for a new one.

## 2020-04-04 NOTE — Telephone Encounter (Signed)
Pt given the [facility] number [phone] to help him find a new PCP.

## 2020-04-13 ENCOUNTER — Ambulatory Visit: Payer: Federal, State, Local not specified - PPO | Admitting: Nurse Practitioner

## 2020-05-02 DIAGNOSIS — Z6841 Body Mass Index (BMI) 40.0 and over, adult: Secondary | ICD-10-CM | POA: Diagnosis not present

## 2020-05-02 DIAGNOSIS — M21612 Bunion of left foot: Secondary | ICD-10-CM | POA: Diagnosis not present

## 2020-05-02 DIAGNOSIS — M1711 Unilateral primary osteoarthritis, right knee: Secondary | ICD-10-CM | POA: Diagnosis not present

## 2020-05-11 ENCOUNTER — Ambulatory Visit: Payer: Federal, State, Local not specified - PPO | Admitting: Podiatry

## 2020-05-11 ENCOUNTER — Other Ambulatory Visit: Payer: Self-pay

## 2020-05-11 DIAGNOSIS — M19072 Primary osteoarthritis, left ankle and foot: Secondary | ICD-10-CM

## 2020-05-11 DIAGNOSIS — M7752 Other enthesopathy of left foot: Secondary | ICD-10-CM

## 2020-05-11 DIAGNOSIS — M19079 Primary osteoarthritis, unspecified ankle and foot: Secondary | ICD-10-CM

## 2020-05-12 ENCOUNTER — Ambulatory Visit: Payer: Federal, State, Local not specified - PPO | Admitting: Podiatry

## 2020-05-12 NOTE — Progress Notes (Signed)
Subjective:  Patient ID: Andrew Graham, male    DOB: [birth year],  MRN: [id number]  Chief Complaint  Patient presents with  . Foot Pain    pt is here for left foot pain, located on the lateral side and the top of the left foot    54 y.o. male presents with the above complaint. Patient presents with painful left dorsal midfoot as well as left submetatarsal 5 joint pain. Patient states been going on for quite some time is going for about 2 to 3 months. Patient states constantly on his feet which causes him a lot of pain. When he has been standing for a long period of time. Patient has not tried any further treatment. Patient brought his own x-rays which were uploaded by my office. He denies any other acute complaints. The x-rays that he brought [place] were negative for any kind acute fractures or any kind of bony abnormalities. He would like to know what his treatment options were.   Review of Systems: Negative except as noted in the HPI. Denies N/V/F/Ch.  Past Medical History:  Diagnosis Date  . Allergy   . Arthritis   . Glucose intolerance (impaired glucose tolerance)   . Hypogonadism in male   . Obstructive sleep apnea     Current Outpatient Medications:  .  albuterol (PROVENTIL HFA;VENTOLIN HFA) 108 (90 Base) MCG/ACT inhaler, Inhale 2 puffs into the lungs every 4 (four) hours as needed for wheezing or shortness of breath (cough, shortness of breath or wheezing.)., Disp: 1 Inhaler, Rfl: 1 .  aspirin 81 MG chewable tablet, Chew 81 mg by mouth daily., Disp: , Rfl:  .  famotidine (PEPCID) 10 MG tablet, Take 10 mg by mouth 2 (two) times daily., Disp: , Rfl:  .  hydrocortisone 2.5 % ointment, Apply topically 2 (two) times daily., Disp: 30 g, Rfl: 4 .  ibuprofen (ADVIL,MOTRIN) 800 MG tablet, Take 1 tablet (800 mg total) by mouth every 8 (eight) hours as needed., Disp: 30 tablet, Rfl: 0 .  predniSONE (STERAPRED UNI-PAK 21 TAB) 10 MG (21) TBPK tablet, Take 6 tablets on the first day and decrease  by 1 tablet each day until finished., Disp: 21 tablet, Rfl: 0  Social History   Tobacco Use  Smoking Status Never Smoker  Smokeless Tobacco Never Used    Allergies  Allergen Reactions  . Tomato Anaphylaxis  . Nutritional Supplements Swelling    Pt not sure where this came frome  . Strawberry Extract Hives  . Sulfa Drugs Cross Reactors Other (See Comments)    Unknown   Objective:  There were no vitals filed for this visit. There is no height or weight on file to calculate BMI. Constitutional Well developed. Well nourished.  Vascular Dorsalis pedis pulses palpable bilaterally. Posterior tibial pulses palpable bilaterally. Capillary refill normal to all digits.  No cyanosis or clubbing noted. Pedal hair growth normal.  Neurologic Normal speech. Oriented to person, place, and time. Epicritic sensation to light touch grossly present bilaterally.  Dermatologic Nails well groomed and normal in appearance. No open wounds. No skin lesions.  Orthopedic:  Pain on palpation to the left fifth metatarsophalangeal joint with range of motion.  Pain with active range of motion of the left fifth metatarsophalangeal joint.  No extensor or flexor pain on palpation noted.  Pain on palpation to the left dorsal midfoot at the tarsometatarsal joint between first and second.  Pain on palpation.  No pain with extensor tendon resisted dorsiflexion of the digits.  Radiographs: Patient brought in his own x-rays which were evaluated: No osseous abnormalities noted.  Mild arthritic changes noted to the midfoot.  No osseous fractures noted. Assessment:   1. Capsulitis of metatarsophalangeal (MTP) joint of left foot   2. Arthritis of midfoot    Plan:  Patient was evaluated and treated and all questions answered.  Left dorsal midfoot arthritis -I explained patient the etiology of arthritic changes and various treatment options were discussed.  I believe patient will benefit from steroid injection help  decrease the acute inflammatory component associated pain.  Patient agrees to proceed with a steroid injection -A steroid injection was performed at left dorsal midfoot using 1% plain Lidocaine and 10 mg of Kenalog. This was well tolerated.  Left submetatarsal 5/fifth metatarsophalangeal joint capsulitis -I explained to the patient the etiology of capsulitis and various treatment options were discussed this is likely due to compensation due to gait overloading the fifth metatarsophalangeal joint.  No intra-articular changes noted on x-ray as well as no crepitus noted of the joint.  I believe patient will benefit from steroid injection help decrease her capsulitis and inflammation from pain.  Patient agrees and would like to proceed with a steroid injection here as well. -A steroid injection was performed at left fifth MTP using 1% plain Lidocaine and 10 mg of Kenalog. This was well tolerated.   No follow-ups on file.

## 2020-05-15 ENCOUNTER — Encounter: Payer: Self-pay | Admitting: Podiatry

## 2020-05-16 ENCOUNTER — Ambulatory Visit: Payer: Federal, State, Local not specified - PPO | Admitting: Podiatry

## 2020-05-17 ENCOUNTER — Ambulatory Visit: Payer: Federal, State, Local not specified - PPO | Admitting: Podiatry

## 2020-06-13 ENCOUNTER — Ambulatory Visit: Payer: Federal, State, Local not specified - PPO | Admitting: Podiatry

## 2020-10-09 DIAGNOSIS — M1711 Unilateral primary osteoarthritis, right knee: Secondary | ICD-10-CM | POA: Diagnosis not present

## 2020-10-16 DIAGNOSIS — M1711 Unilateral primary osteoarthritis, right knee: Secondary | ICD-10-CM | POA: Diagnosis not present

## 2020-10-23 DIAGNOSIS — M1711 Unilateral primary osteoarthritis, right knee: Secondary | ICD-10-CM | POA: Diagnosis not present

## 2020-11-18 DIAGNOSIS — J019 Acute sinusitis, unspecified: Secondary | ICD-10-CM | POA: Diagnosis not present

## 2020-11-18 DIAGNOSIS — R059 Cough, unspecified: Secondary | ICD-10-CM | POA: Diagnosis not present

## 2020-12-09 DIAGNOSIS — R051 Acute cough: Secondary | ICD-10-CM | POA: Diagnosis not present

## 2020-12-09 DIAGNOSIS — R110 Nausea: Secondary | ICD-10-CM | POA: Diagnosis not present

## 2020-12-09 DIAGNOSIS — Z20822 Contact with and (suspected) exposure to covid-19: Secondary | ICD-10-CM | POA: Diagnosis not present

## 2020-12-09 DIAGNOSIS — H6123 Impacted cerumen, bilateral: Secondary | ICD-10-CM | POA: Diagnosis not present

## 2020-12-13 ENCOUNTER — Emergency Department
Admission: EM | Admit: 2020-12-13 | Discharge: 2020-12-13 | Disposition: A | Discharge status: Home / Self Care | Payer: Federal, State, Local not specified - PPO | Attending: Emergency Medicine | Admitting: Emergency Medicine

## 2020-12-13 ENCOUNTER — Emergency Department: Payer: Federal, State, Local not specified - PPO

## 2020-12-13 ENCOUNTER — Encounter: Payer: Self-pay | Admitting: Emergency Medicine

## 2020-12-13 ENCOUNTER — Other Ambulatory Visit: Payer: Self-pay

## 2020-12-13 DIAGNOSIS — H6983 Other specified disorders of Eustachian tube, bilateral: Secondary | ICD-10-CM

## 2020-12-13 DIAGNOSIS — R0981 Nasal congestion: Secondary | ICD-10-CM | POA: Insufficient documentation

## 2020-12-13 DIAGNOSIS — J3489 Other specified disorders of nose and nasal sinuses: Secondary | ICD-10-CM | POA: Insufficient documentation

## 2020-12-13 DIAGNOSIS — Z7982 Long term (current) use of aspirin: Secondary | ICD-10-CM | POA: Diagnosis not present

## 2020-12-13 DIAGNOSIS — J329 Chronic sinusitis, unspecified: Secondary | ICD-10-CM | POA: Diagnosis not present

## 2020-12-13 DIAGNOSIS — H6993 Unspecified Eustachian tube disorder, bilateral: Secondary | ICD-10-CM | POA: Insufficient documentation

## 2020-12-13 DIAGNOSIS — R059 Cough, unspecified: Secondary | ICD-10-CM | POA: Insufficient documentation

## 2020-12-13 DIAGNOSIS — R21 Rash and other nonspecific skin eruption: Secondary | ICD-10-CM | POA: Diagnosis not present

## 2020-12-13 MED ORDER — HYDROCOD POLST-CPM POLST ER 10-8 MG/5ML PO SUER: 5.0000 mL | Freq: Two times a day (BID) | ORAL | 0 refills | Status: AC

## 2020-12-13 MED ORDER — METHYLPREDNISOLONE 4 MG PO TBPK: ORAL_TABLET | ORAL | 0 refills | Status: DC

## 2020-12-13 MED ORDER — BENZONATATE 100 MG PO CAPS: 200.0000 mg | ORAL_CAPSULE | Freq: Three times a day (TID) | ORAL | 0 refills | Status: AC | PRN

## 2020-12-13 NOTE — ED Notes (Signed)
See triage note  Presents with cont'd cough and congestion   No fever  States he has been seen twice for same  And has had 2 negative COVID test

## 2020-12-13 NOTE — ED Triage Notes (Signed)
Pt comes into the ED via POV c/o cough and nasal congestion.  Pt states the cough originally started in December.  Pt has been to next care in January where they said his chest sounded clear and dx it as a sinus infection.  It got better until Monday a week ago.  Pt has even and unlabored respirations.

## 2020-12-13 NOTE — Discharge Instructions (Signed)
Follow discharge care instruction take medication as directed. °

## 2020-12-13 NOTE — ED Provider Notes (Signed)
[facility] Emergency Department Provider Note   ____________________________________________   Event Date/Time   First MD Initiated Contact with Patient 12/13/20 1354     (approximate)  I have reviewed the triage vital signs and the nursing notes.   HISTORY  Chief Complaint Cough and Nasal Congestion    HPI GAVIN TELFORD is a 55 y.o. male patient complain of chest and nasal congestion for over a month.  Patient was seen 2 times in urgent care clinic.  Patient had chest x-rays last week and with no acute findings.  Patient was diagnosed on his original visit in December with sinusitis and prescribed Augmentin.  Notice much improvement until a week ago when his symptoms returned.  Patient still has a cough secondary to postnasal drainage.  Patient also complained of ear pressure.  States intermittent facial pressure.  Denies fever associated complaint.  Patient test negative for COVID-19 2 days ago.  Patient has taken vaccine to include booster shot.  No recent travel.  Patient is planning on traveling to [place] later this month.         Past Medical History:  Diagnosis Date  . Allergy   . Arthritis   . Glucose intolerance (impaired glucose tolerance)   . Hypogonadism in male   . Obstructive sleep apnea     Patient Active Problem List   Diagnosis Date Noted  . Dyspnea on exertion 05/26/2018  . Morbid obesity ([place]) 01/12/2015  . Glucose intolerance (impaired glucose tolerance) 01/12/2015  . OSA (obstructive sleep apnea) 01/12/2015  . Dyslipidemia 01/12/2015    Past Surgical History:  Procedure Laterality Date  . LEFT HEART CATHETERIZATION WITH CORONARY ANGIOGRAM N/A 10/10/2011   Procedure: LEFT HEART CATHETERIZATION WITH CORONARY ANGIOGRAM;  Surgeon: Lorretta Harp, MD;  Location: [facility] CATH LAB;  Service: Cardiovascular;  Laterality: N/A;  . VASECTOMY  1998    Prior to Admission medications   Medication Sig Start Date End Date Taking?  Authorizing Provider  benzonatate (TESSALON PERLES) 100 MG capsule Take 2 capsules (200 mg total) by mouth 3 (three) times daily as needed. 12/13/20 12/13/21 Yes Sable Feil, PA-C  chlorpheniramine-HYDROcodone [facility] PENNKINETIC ER) 10-8 MG/5ML SUER Take 5 mLs by mouth 2 (two) times daily. 12/13/20  Yes Sable Feil, PA-C  methylPREDNISolone (MEDROL DOSEPAK) 4 MG TBPK tablet Take [place] dose as directed 12/13/20  Yes Sable Feil, PA-C  albuterol (PROVENTIL HFA;VENTOLIN HFA) 108 (90 Base) MCG/ACT inhaler Inhale 2 puffs into the lungs every 4 (four) hours as needed for wheezing or shortness of breath (cough, shortness of breath or wheezing.). 05/15/17   Posey Boyer, MD  aspirin 81 MG chewable tablet Chew 81 mg by mouth daily.    [provider]  famotidine (PEPCID) 10 MG tablet Take 10 mg by mouth 2 (two) times daily.    [provider]  hydrocortisone 2.5 % ointment Apply topically 2 (two) times daily. 01/11/15   Wardell Honour, MD  ibuprofen (ADVIL,MOTRIN) 800 MG tablet Take 1 tablet (800 mg total) by mouth every 8 (eight) hours as needed. 02/02/17   Rudene Re, MD    Allergies Tomato, Nutritional supplements, Strawberry extract, and Sulfa drugs cross reactors  Family History  Problem Relation Age of Onset  . Cancer Maternal Grandmother        throat  . Parkinson's disease Mother   . Obesity Mother   . Parkinson's disease Other     Social History Social History   Tobacco  Use  . Smoking status: Never Smoker  . Smokeless tobacco: Never Used  Substance Use Topics  . Alcohol use: Yes    Alcohol/week: 0.0 standard drinks    Comment: rarely - 1 to 2  . Drug use: No    Review of Systems Constitutional: No fever/chills Eyes: No visual changes. ENT: Ear pressure, sore throat, and nasal congestion. Cardiovascular: Denies chest pain. Respiratory: Denies shortness of breath.  Nonproductive cough. Gastrointestinal: No abdominal pain.  No nausea, no  vomiting.  No diarrhea.  No constipation. Genitourinary: Negative for dysuria. Musculoskeletal: Negative for back pain. Skin: Negative for rash. Neurological: Negative for headaches, focal weakness or numbness. Allergic/Immunilogical: Tomato strawberries and sulfa.  ____________________________________________   PHYSICAL EXAM:  VITAL SIGNS: ED Triage Vitals  Enc Vitals Group     BP 12/13/20 1351 (!) 138/95     Pulse Rate 12/13/20 1351 (!) 109     Resp 12/13/20 1351 20     Temp 12/13/20 1351 98.3 F (36.8 C)     Temp Source 12/13/20 1351 Oral     SpO2 12/13/20 1351 98 %     Weight 12/13/20 1352 (!) 315 lb (142.9 kg)     Height 12/13/20 1352 5\' 8"  (1.727 m)     Head Circumference --      Peak Flow --      Pain Score 12/13/20 1359 0     Pain Loc --      Pain Edu? --      Excl. in [place]? --    Constitutional: Alert and oriented. Well appearing and in no acute distress. Eyes: Conjunctivae are normal. PERRL. EOMI. Head: Atraumatic. EARS: Ear canals are clear.  Edematous but not erythematous TMs. Nose: Edematous nasal turbinates with clear rhinorrhea. Mouth/Throat: Mucous membranes are moist.  Postnasal drainage.  Oropharynx non-erythematous. Neck: No stridor.  Hematological/Lymphatic/Immunilogical: No cervical lymphadenopathy. Cardiovascular: Normal rate, regular rhythm. Grossly normal heart sounds.  Good peripheral circulation. Respiratory: Normal respiratory effort.  No retractions. Lungs CTAB. Skin:  Skin is warm, dry and intact. No rash noted. .  ____________________________________________   LABS (all labs ordered are listed, but only abnormal results are displayed)  Labs Reviewed - No data to display ____________________________________________  EKG   ____________________________________________  RADIOLOGY I, Sable Feil, personally viewed and evaluated these images (plain radiographs) as part of my medical decision making, as well as reviewing the written  report by the radiologist.  ED MD interpretation: No acute findings on sinus and chest x-rays. Official radiology report(s): DG Sinuses Complete  Result Date: 12/13/2020 CLINICAL DATA:  Facial pressure, cough. EXAM: PARANASAL SINUSES - COMPLETE 3 + VIEW COMPARISON:  None. FINDINGS: The paranasal sinus are aerated. There is no evidence of sinus opacification air-fluid levels or mucosal thickening. No significant bone abnormalities are seen. IMPRESSION: Negative. Electronically Signed   By: Marijo Conception M.D.   On: 12/13/2020 15:09   DG Chest 2 View  Result Date: 12/13/2020 CLINICAL DATA:  55 year old male with cough and congestion. EXAM: CHEST - 2 VIEW COMPARISON:  Chest radiograph dated 05/11/2018. FINDINGS: The heart size and mediastinal contours are within normal limits. Both lungs are clear. The visualized skeletal structures are unremarkable. IMPRESSION: No active cardiopulmonary disease. Electronically Signed   By: Anner Crete M.D.   On: 12/13/2020 15:04    ____________________________________________   PROCEDURES  Procedure(s) performed (including Critical Care):  Procedures   ____________________________________________   INITIAL IMPRESSION / ASSESSMENT AND PLAN / ED COURSE  As part of  my medical decision making, I reviewed the following data within the [place]         Patient presents for 6 weeks of intermitting nasal congestion and rhinorrhea.  Bilateral ear pressure/pain with nonproductive cough.  Discussed no acute findings x-rays of the facial sinus and chest.  Patient complaint physical exam consistent with sinus congestion with postnasal drainage causing a cough.  Patient given discharge care instruction advised take medication as directed.  Patient advised follow-up PCP.      ____________________________________________   FINAL CLINICAL IMPRESSION(S) / ED DIAGNOSES  Final diagnoses:  Sinus congestion  Cough  Eustachian tube  dysfunction, bilateral     ED Discharge Orders         Ordered    chlorpheniramine-HYDROcodone (TUSSIONEX PENNKINETIC ER) 10-8 MG/5ML SUER  2 times daily        12/13/20 1547    benzonatate (TESSALON PERLES) 100 MG capsule  3 times daily PRN        12/13/20 1547    methylPREDNISolone (MEDROL DOSEPAK) 4 MG TBPK tablet        12/13/20 1547          *Please note:  Denese Killings was evaluated in Emergency Department on 12/13/2020 for the symptoms described in the history of present illness. He was evaluated in the context of the global COVID-19 pandemic, which necessitated consideration that the patient might be at risk for infection with the SARS-CoV-2 virus that causes COVID-19. Institutional protocols and algorithms that pertain to the evaluation of patients at risk for COVID-19 are in a state of rapid change based on information released by regulatory bodies including the CDC and federal and state organizations. These policies and algorithms were followed during the patient's care in the ED.  Some ED evaluations and interventions may be delayed as a result of limited staffing during and the pandemic.*   Note:  This document was prepared using Dragon voice recognition software and may include unintentional dictation errors.    Sable Feil, PA-C 12/13/20 1557    Blake Divine, MD 12/14/20 [phone]

## 2021-01-11 DIAGNOSIS — H6983 Other specified disorders of Eustachian tube, bilateral: Secondary | ICD-10-CM | POA: Diagnosis not present

## 2021-01-11 DIAGNOSIS — J0100 Acute maxillary sinusitis, unspecified: Secondary | ICD-10-CM | POA: Diagnosis not present

## 2021-01-11 DIAGNOSIS — H903 Sensorineural hearing loss, bilateral: Secondary | ICD-10-CM | POA: Diagnosis not present

## 2021-01-11 DIAGNOSIS — J301 Allergic rhinitis due to pollen: Secondary | ICD-10-CM | POA: Diagnosis not present

## 2021-03-20 DIAGNOSIS — M1711 Unilateral primary osteoarthritis, right knee: Secondary | ICD-10-CM | POA: Diagnosis not present

## 2022-01-25 ENCOUNTER — Encounter: Payer: Federal, State, Local not specified - PPO | Attending: Family Medicine | Admitting: Dietician

## 2022-01-25 ENCOUNTER — Other Ambulatory Visit: Payer: Self-pay

## 2022-01-25 ENCOUNTER — Encounter: Payer: Self-pay | Admitting: Dietician

## 2022-01-25 VITALS — Ht 69.0 in | Wt 302.5 lb

## 2022-01-25 DIAGNOSIS — E119 Type 2 diabetes mellitus without complications: Secondary | ICD-10-CM

## 2022-01-25 NOTE — Patient Instructions (Signed)
Great job working to decrease sugar and starches, keep it up! ?Make sure to include a protein food + a fruit or veg (green beans) with each meal for a healthy balance.  ?Continue with regular exercise. ?Allow up to 45-60 grams of carbodyrate with each meal. ?

## 2022-01-25 NOTE — Progress Notes (Signed)
Medical Nutrition Therapy: Visit start time: 1100  end time: 1200  ?Assessment:  Diagnosis: Type 2 diabetes ?Past medical history: sleep apnea, obesity ?Psychosocial issues/ stress concerns: none ? ?Preferred learning method:  ?Visual ? ? ?Current weight: 302.5lbs Height: 5'9" BMI: 44.67 ? ?Medications, supplements: reconciled list in medical record ? ?Progress and evaluation:  ?Patient reports making some significant diet changes since diabetes diagnosis 12/2021. He stopped ice cream altogether along with other sweets, was eating large bowl nightly.  He has reduced sugary beverages and portions of starchy foods. ?He is checking BGs daily in am, which he reports have decreased to 170s or below. ?Does not eat veg other than green beans, black eyed peas  ?Came from pre-diabetes class at this office in 2016 ?Recent HbA1C was 12.3% (12/27/21) ? ?Physical activity: walking 30 minutes, 4 times a week; planning to increase ? ?Dietary Intake:  ?Usual eating pattern includes 3 meals and 2-3 snacks per day. ?Dining out frequency: 1-3 meals per week. ? ?Breakfast: 2 eggs, 2 sausage patties pork or [place], 2 sl toast ?Snack: pkg peanut butter crackers ?Lunch: leftovers or 2 bologna sandwiches ?Snack: same as am ?Supper: meat + potato/ pasta + green beans/ seafood + baked potato; 3/18 hibachi chicken and shrimp with rice; likes burger and fries ?Snack: sugar free pudding occ with sugar free whipped cream ?Beverages: water 24 oz 3-5x daily with sugar free flavoring; pepsi zero; lightly sweetened tea; occ sweet tea or reg soda when eating out ? ?Nutrition Care Education: ?Topics covered:  ?Basic nutrition: basic food groups, appropriate nutrient balance, appropriate meal and snack schedule, general nutrition guidelines    ?Weight control: importance of low sugar and low fat choices, portion control; estimated energy needs for weight loss at 1800 kcal, provided guidance for 45% CHO, 25% protein, 30% fat; role of physical  activity ?Diabetes: appropriate meal and snack schedule, appropriate carb intake and balance, healthy carb choices, role of fiber, protein, fat; importance of controlling blood lipids and BP to reduce heart disease risk by limiting unhealthy fats, high sodium intake, and increasing fruits and vegetables ? ? ?Nutritional Diagnosis:  [place]-2.2 Altered nutrition-related laboratory As related to Type 2 Diabetes.  As evidenced by elevated HbA1C and patient reported home BG testing results. ?[place]-3.3 Overweight/obesity As related to excess calories.  As evidenced by patient reported diet history, with current BMI of 44.67. ? ?Intervention:  ?Instruction and discussion as noted above. ?Patient has been making positive diet and lifestyle changes and is motivated to continue. ?He will continue to work on controlling carbohydrate intake, particularly refined carbs.  ?He does not feel he needs MNT follow up at this time but will schedule later if needed. ? ?Product/process development scientist given:  ?General diet guidelines for Diabetes ?Plate Planner with food lists, sample meal pattern ?Sample  menus ?Diabetes-Friendly Menus and Recipes ?Visit summary with goals/ instructions ? ? ?Learner/ who was taught:  ?Patient  ? ?Level of understanding: ?Verbalizes/ demonstrates competency ? ? ?Demonstrated degree of understanding via:   Teach back ?Learning barriers: ?None ? ?Willingness to learn/ readiness for change: ?Eager, change in progress ? ? ?Monitoring and Evaluation:  Dietary intake, exercise, BG control, and body weight ?     follow up: prn  ?

## 2022-08-22 NOTE — H&P (Signed)
Pre-Procedure H&P   Patient ID: Andrew Graham is a 55 y.o. male.  Gastroenterology Provider: Annamaria Helling, DO  Referring Provider: Dr. Tamala Julian PCP: Wardell Honour, MD  Date: 08/23/2022  HPI Andrew Graham is a 56 y.o. male who presents today for Colonoscopy for Initial screening colonoscopy.  Bowel movements been regular without melena or hematochezia.  He believes he may has sleep apnea but this has not been diagnosed. Pt took his trulicity this past Wednesday. Will require intubation/general anesthesia for procedure  Most recent A1c 5.8 creatinine 0.7 hemoglobin 14 MCV 77 platelets 284,000   Past Medical History:  Diagnosis Date   Allergy    Arthritis    Diabetes mellitus without complication (HCC)    Glucose intolerance (impaired glucose tolerance)    Hypogonadism in male    Obstructive sleep apnea     Past Surgical History:  Procedure Laterality Date   LEFT HEART CATHETERIZATION WITH CORONARY ANGIOGRAM N/A 10/10/2011   Procedure: LEFT HEART CATHETERIZATION WITH CORONARY ANGIOGRAM;  Surgeon: Lorretta Harp, MD;  Location: [facility] CATH LAB;  Service: Cardiovascular;  Laterality: N/A;   VASECTOMY  1998    Family History No h/o GI disease or malignancy  Review of Systems  Constitutional:  Negative for activity change, appetite change, chills, diaphoresis, fatigue, fever and unexpected weight change.  HENT:  Negative for trouble swallowing and voice change.   Respiratory:  Negative for shortness of breath and wheezing.   Cardiovascular:  Negative for chest pain, palpitations and leg swelling.  Gastrointestinal:  Negative for abdominal distention, abdominal pain, anal bleeding, blood in stool, constipation, diarrhea, nausea and vomiting.  Musculoskeletal:  Negative for arthralgias and myalgias.  Skin:  Negative for color change and pallor.  Neurological:  Negative for dizziness, syncope and weakness.  Psychiatric/Behavioral:  Negative for confusion. The  patient is not nervous/anxious.   All other systems reviewed and are negative.    Medications No current facility-administered medications on file prior to encounter.   Current Outpatient Medications on File Prior to Encounter  Medication Sig Dispense Refill   ACCU-CHEK GUIDE test strip USE 1 STRIP DAILY AS DIRECTED     Accu-Chek Softclix Lancets lancets 1 each daily.     Blood Glucose Monitoring Suppl (ACCU-CHEK GUIDE ME) w/Device KIT See admin instructions.     chlorpheniramine-HYDROcodone (TUSSIONEX PENNKINETIC ER) 10-8 MG/5ML SUER Take 5 mLs by mouth 2 (two) times daily. 115 mL 0   hydrocortisone 2.5 % ointment Apply topically 2 (two) times daily. 30 g 4   ibuprofen (ADVIL,MOTRIN) 800 MG tablet Take 1 tablet (800 mg total) by mouth every 8 (eight) hours as needed. 30 tablet 0   metFORMIN (GLUCOPHAGE) 500 MG tablet Take by mouth.     methylPREDNISolone (MEDROL DOSEPAK) 4 MG TBPK tablet Take Tapered dose as directed 21 tablet 0   OMEPRAZOLE PO Take by mouth.     albuterol (PROVENTIL HFA;VENTOLIN HFA) 108 (90 Base) MCG/ACT inhaler Inhale 2 puffs into the lungs every 4 (four) hours as needed for wheezing or shortness of breath (cough, shortness of breath or wheezing.). (Patient not taking: Reported on 08/23/2022) 1 Inhaler 1   aspirin 81 MG chewable tablet Chew 81 mg by mouth daily. (Patient not taking: Reported on 08/23/2022)      Pertinent medications related to GI and procedure were reviewed by me with the patient prior to the procedure   Current Facility-Administered Medications:    0.9 %  sodium chloride infusion, , Intravenous, Continuous,  Annamaria Helling, DO, Last Rate: 20 mL/hr at 08/23/22 1110, 20 mL/hr at 08/23/22 1110      Allergies  Allergen Reactions   Tomato Anaphylaxis   Nutritional Supplements Swelling    Pt not sure where this came frome   Strawberry Extract Hives   Sulfa Drugs Cross Reactors Other (See Comments)    Unknown   Allergies were reviewed by me  prior to the procedure  Objective   Body mass index is 43.64 kg/m. Vitals:   08/23/22 1055  BP: (!) 140/94  Pulse: 79  Resp: 20  Temp: (!) 95.9 F (35.5 C)  TempSrc: Temporal  SpO2: 100%  Weight: 130.2 kg  Height: '5\' 8"'  (1.727 m)     Physical Exam Vitals and nursing note reviewed.  Constitutional:      General: He is not in acute distress.    Appearance: Normal appearance. He is obese. He is not ill-appearing, toxic-appearing or diaphoretic.  HENT:     Head: Normocephalic and atraumatic.     Nose: Nose normal.     Mouth/Throat:     Mouth: Mucous membranes are moist.     Pharynx: Oropharynx is clear.  Eyes:     General: No scleral icterus.    Extraocular Movements: Extraocular movements intact.  Cardiovascular:     Rate and Rhythm: Normal rate and regular rhythm.     Heart sounds: Normal heart sounds. No murmur heard.    No friction rub. No gallop.  Pulmonary:     Effort: Pulmonary effort is normal. No respiratory distress.     Breath sounds: Normal breath sounds. No wheezing, rhonchi or rales.  Abdominal:     General: Bowel sounds are normal. There is no distension.     Palpations: Abdomen is soft.     Tenderness: There is no abdominal tenderness. There is no guarding or rebound.  Musculoskeletal:     Cervical back: Neck supple.     Right lower leg: No edema.     Left lower leg: No edema.  Skin:    General: Skin is warm and dry.     Coloration: Skin is not jaundiced or pale.  Neurological:     General: No focal deficit present.     Mental Status: He is alert and oriented to person, place, and time. Mental status is at baseline.  Psychiatric:        Mood and Affect: Mood normal.        Behavior: Behavior normal.        Thought Content: Thought content normal.        Judgment: Judgment normal.      Assessment:  Mr. Andrew Graham is a 56 y.o. male  who presents today for Colonoscopy for Initial screening colonoscopy.  Plan:  Colonoscopy with possible  intervention today  Colonoscopy with possible biopsy, control of bleeding, polypectomy, and interventions as necessary has been discussed with the patient/patient representative. Informed consent was obtained from the patient/patient representative after explaining the indication, nature, and risks of the procedure including but not limited to death, bleeding, perforation, missed neoplasm/lesions, cardiorespiratory compromise, and reaction to medications. Opportunity for questions was given and appropriate answers were provided. Patient/patient representative has verbalized understanding is amenable to undergoing the procedure.   Annamaria Helling, DO  [facility] Gastroenterology  Portions of the record may have been created with voice recognition software. Occasional wrong-word or 'sound-a-like' substitutions may have occurred due to the inherent limitations of voice recognition software.  Read the chart carefully and recognize, using context, where substitutions may have occurred.

## 2022-08-23 ENCOUNTER — Ambulatory Visit: Payer: Federal, State, Local not specified - PPO | Admitting: General Practice

## 2022-08-23 ENCOUNTER — Ambulatory Visit
Admission: RE | Admit: 2022-08-23 | Discharge: 2022-08-23 | Disposition: A | Discharge status: Home / Self Care | Payer: Federal, State, Local not specified - PPO | Source: Ambulatory Visit | Attending: Gastroenterology | Admitting: Gastroenterology

## 2022-08-23 ENCOUNTER — Encounter: Payer: Self-pay | Admitting: Gastroenterology

## 2022-08-23 ENCOUNTER — Encounter
Admission: RE | Disposition: A | Discharge status: Home / Self Care | Payer: Self-pay | Source: Ambulatory Visit | Attending: Gastroenterology

## 2022-08-23 DIAGNOSIS — I2510 Atherosclerotic heart disease of native coronary artery without angina pectoris: Secondary | ICD-10-CM | POA: Insufficient documentation

## 2022-08-23 DIAGNOSIS — Z7985 Long-term (current) use of injectable non-insulin antidiabetic drugs: Secondary | ICD-10-CM | POA: Insufficient documentation

## 2022-08-23 DIAGNOSIS — J449 Chronic obstructive pulmonary disease, unspecified: Secondary | ICD-10-CM | POA: Insufficient documentation

## 2022-08-23 DIAGNOSIS — K635 Polyp of colon: Secondary | ICD-10-CM | POA: Diagnosis not present

## 2022-08-23 DIAGNOSIS — Z7984 Long term (current) use of oral hypoglycemic drugs: Secondary | ICD-10-CM | POA: Diagnosis not present

## 2022-08-23 DIAGNOSIS — K5730 Diverticulosis of large intestine without perforation or abscess without bleeding: Secondary | ICD-10-CM | POA: Diagnosis not present

## 2022-08-23 DIAGNOSIS — Z1211 Encounter for screening for malignant neoplasm of colon: Secondary | ICD-10-CM | POA: Diagnosis present

## 2022-08-23 DIAGNOSIS — D124 Benign neoplasm of descending colon: Secondary | ICD-10-CM | POA: Diagnosis not present

## 2022-08-23 DIAGNOSIS — E119 Type 2 diabetes mellitus without complications: Secondary | ICD-10-CM | POA: Diagnosis not present

## 2022-08-23 DIAGNOSIS — K640 First degree hemorrhoids: Secondary | ICD-10-CM | POA: Diagnosis not present

## 2022-08-23 DIAGNOSIS — D122 Benign neoplasm of ascending colon: Secondary | ICD-10-CM | POA: Insufficient documentation

## 2022-08-23 DIAGNOSIS — G4733 Obstructive sleep apnea (adult) (pediatric): Secondary | ICD-10-CM | POA: Diagnosis not present

## 2022-08-23 DIAGNOSIS — D120 Benign neoplasm of cecum: Secondary | ICD-10-CM | POA: Diagnosis not present

## 2022-08-23 DIAGNOSIS — K621 Rectal polyp: Secondary | ICD-10-CM | POA: Insufficient documentation

## 2022-08-23 HISTORY — PX: COLONOSCOPY: SHX5424

## 2022-08-23 HISTORY — DX: Type 2 diabetes mellitus without complications: E11.9

## 2022-08-23 LAB — GLUCOSE, CAPILLARY: Glucose-Capillary: 107 mg/dL — ABNORMAL HIGH (ref 70–99)

## 2022-08-23 SURGERY — COLONOSCOPY
Anesthesia: General

## 2022-08-23 MED ORDER — PROPOFOL 10 MG/ML IV BOLUS
INTRAVENOUS | Status: DC | PRN
  Administered 2022-08-23: 200 mg via INTRAVENOUS

## 2022-08-23 MED ORDER — ONDANSETRON HCL 4 MG/2ML IJ SOLN
INTRAMUSCULAR | Status: DC | PRN
  Administered 2022-08-23: 4 mg via INTRAVENOUS

## 2022-08-23 MED ORDER — MIDAZOLAM HCL 2 MG/2ML IJ SOLN
INTRAMUSCULAR | Status: AC
  Filled 2022-08-23: qty 2

## 2022-08-23 MED ORDER — SODIUM CHLORIDE 0.9 % IV SOLN: INTRAVENOUS | Status: DC | PRN

## 2022-08-23 MED ORDER — PROPOFOL 10 MG/ML IV BOLUS
INTRAVENOUS | Status: AC
  Filled 2022-08-23: qty 20

## 2022-08-23 MED ORDER — SODIUM CHLORIDE 0.9 % IV SOLN
INTRAVENOUS | Status: DC
  Administered 2022-08-23: 20 mL/h via INTRAVENOUS

## 2022-08-23 MED ORDER — EPHEDRINE 5 MG/ML INJ
INTRAVENOUS | Status: AC
  Filled 2022-08-23: qty 5

## 2022-08-23 MED ORDER — PHENYLEPHRINE HCL (PRESSORS) 10 MG/ML IV SOLN
INTRAVENOUS | Status: DC | PRN
  Administered 2022-08-23 (×4): 160 ug via INTRAVENOUS

## 2022-08-23 MED ORDER — LIDOCAINE HCL (PF) 2 % IJ SOLN
INTRAMUSCULAR | Status: AC
  Filled 2022-08-23: qty 5

## 2022-08-23 MED ORDER — DEXAMETHASONE SODIUM PHOSPHATE 10 MG/ML IJ SOLN
INTRAMUSCULAR | Status: DC | PRN
  Administered 2022-08-23: 5 mg via INTRAVENOUS

## 2022-08-23 MED ORDER — MIDAZOLAM HCL 2 MG/2ML IJ SOLN
INTRAMUSCULAR | Status: DC | PRN
  Administered 2022-08-23: 2 mg via INTRAVENOUS

## 2022-08-23 MED ORDER — FENTANYL CITRATE (PF) 100 MCG/2ML IJ SOLN
INTRAMUSCULAR | Status: AC
  Filled 2022-08-23: qty 2

## 2022-08-23 MED ORDER — FENTANYL CITRATE (PF) 100 MCG/2ML IJ SOLN
INTRAMUSCULAR | Status: DC | PRN
  Administered 2022-08-23 (×2): 50 ug via INTRAVENOUS

## 2022-08-23 MED ORDER — LIDOCAINE HCL (CARDIAC) PF 100 MG/5ML IV SOSY
PREFILLED_SYRINGE | INTRAVENOUS | Status: DC | PRN
  Administered 2022-08-23: 100 mg via INTRAVENOUS

## 2022-08-23 MED ORDER — SUCCINYLCHOLINE CHLORIDE 200 MG/10ML IV SOSY
PREFILLED_SYRINGE | INTRAVENOUS | Status: AC
  Filled 2022-08-23: qty 10

## 2022-08-23 MED ORDER — SUCCINYLCHOLINE CHLORIDE 200 MG/10ML IV SOSY
PREFILLED_SYRINGE | INTRAVENOUS | Status: DC | PRN
  Administered 2022-08-23: 120 mg via INTRAVENOUS

## 2022-08-23 NOTE — Anesthesia Preprocedure Evaluation (Signed)
Anesthesia Evaluation  Patient identified by MRN, date of birth, ID band Patient awake    Reviewed: Allergy & Precautions, NPO status , Patient's Chart, lab work & pertinent test results  History of Anesthesia Complications Negative for: history of anesthetic complications  Airway Mallampati: III  TM Distance: >3 FB Neck ROM: full    Dental  (+) Dental Advidsory Given, Poor Dentition   Pulmonary asthma , sleep apnea and Continuous Positive Airway Pressure Ventilation , neg COPD,    Pulmonary exam normal        Cardiovascular (-) angina+ CAD  (-) Past MI Normal cardiovascular exam     Neuro/Psych negative neurological ROS  negative psych ROS   GI/Hepatic negative GI ROS, Neg liver ROS,   Endo/Other  negative endocrine ROSdiabetes  Renal/GU      Musculoskeletal   Abdominal   Peds  Hematology negative hematology ROS (+)   Anesthesia Other Findings Past Medical History: No date: Allergy No date: Arthritis No date: Diabetes mellitus without complication (HCC) No date: Glucose intolerance (impaired glucose tolerance) No date: Hypogonadism in male No date: Obstructive sleep apnea  Past Surgical History: 10/10/2011: LEFT HEART CATHETERIZATION WITH CORONARY ANGIOGRAM; N/A     Comment:  Procedure: LEFT HEART CATHETERIZATION WITH CORONARY               ANGIOGRAM;  Surgeon: Lorretta Harp, MD;  Location: [facility]               CATH LAB;  Service: Cardiovascular;  Laterality: N/A; 1998: VASECTOMY  BMI    Body Mass Index: 43.64 kg/m      Reproductive/Obstetrics negative OB ROS                             Anesthesia Physical Anesthesia Plan  ASA: 3  Anesthesia Plan: General ETT   Post-op Pain Management:    Induction: Intravenous  PONV Risk Score and Plan: 3 and Ondansetron, Dexamethasone and Midazolam  Airway Management Planned: Oral ETT  Additional Equipment:   Intra-op Plan:    Post-operative Plan: Extubation in OR  Informed Consent: I have reviewed the patients History and Physical, chart, labs and discussed the procedure including the risks, benefits and alternatives for the proposed anesthesia with the patient or authorized representative who has indicated his/her understanding and acceptance.     Dental Advisory Given  Plan Discussed with: Anesthesiologist, CRNA and Surgeon  Anesthesia Plan Comments: (Patient consented for risks of anesthesia including but not limited to:  - adverse reactions to medications - damage to eyes, teeth, lips or other oral mucosa - nerve damage due to positioning  - sore throat or hoarseness - Damage to heart, brain, nerves, lungs, other parts of body or loss of life  Patient voiced understanding.)        Anesthesia Quick Evaluation

## 2022-08-23 NOTE — Transfer of Care (Signed)
Immediate Anesthesia Transfer of Care Note  Patient: Andrew Graham  Procedure(s) Performed: COLONOSCOPY  Patient Location: PACU  Anesthesia Type:General  Level of Consciousness: awake  Airway & Oxygen Therapy: Patient Spontanous Breathing  Post-op Assessment: Report given to RN and Post -op Vital signs reviewed and stable  Post vital signs: Reviewed and stable  Last Vitals:  Vitals Value Taken Time  BP 116/77 08/23/22 1323  Temp 35.9 1323  Pulse 88 08/23/22 1323  Resp 21 08/23/22 1323  SpO2 97 % 08/23/22 1323  Vitals shown include unvalidated device data.  Last Pain:  Vitals:   08/23/22 1055  TempSrc: Temporal  PainSc: 0-No pain         Complications: No notable events documented.

## 2022-08-23 NOTE — Op Note (Signed)
[facility] Gastroenterology Patient Name: Andrew Graham Procedure Date: 08/23/2022 12:16 PM MRN: [id number] Account #: [id number] Date of Birth: 03-26-1966 Admit Type: Outpatient Age: 56 Room: [facility] ENDO ROOM 1 Gender: Male Note Status: Finalized Instrument Name: Colonoscope [id number] Procedure:             Colonoscopy Indications:           Screening for colorectal malignant neoplasm Providers:             Rueben Bash, DO Referring MD:          Renette Butters. Tamala Julian, MD (Referring MD) Medicines:             General Anesthesia Complications:         No immediate complications. Estimated blood loss:                         Minimal. Procedure:             Pre-Anesthesia Assessment:                        - Prior to the procedure, a History and Physical was                         performed, and patient medications and allergies were                         reviewed. The patient is competent. The risks and                         benefits of the procedure and the sedation options and                         risks were discussed with the patient. All questions                         were answered and informed consent was obtained.                         Patient identification and proposed procedure were                         verified by the physician, the nurse, the                         anesthesiologist, the anesthetist and the technician                         in the endoscopy suite. Mental Status Examination:                         alert and oriented. Airway Examination: normal                         oropharyngeal airway and neck mobility. Respiratory                         Examination: clear to auscultation. CV Examination:  RRR, no murmurs, no S3 or S4. Prophylactic                         Antibiotics: The patient does not require prophylactic                         antibiotics. Prior Anticoagulants: The patient has                          taken no previous anticoagulant or antiplatelet                         agents. ASA Grade Assessment: III - A patient with                         severe systemic disease. After reviewing the risks and                         benefits, the patient was deemed in satisfactory                         condition to undergo the procedure. The anesthesia                         plan was to use general anesthesia. Immediately prior                         to administration of medications, the patient was                         re-assessed for adequacy to receive sedatives. The                         heart rate, respiratory rate, oxygen saturations,                         blood pressure, adequacy of pulmonary ventilation, and                         response to care were monitored throughout the                         procedure. The physical status of the patient was                         re-assessed after the procedure.                        After obtaining informed consent, the colonoscope was                         passed under direct vision. Throughout the procedure,                         the patient's blood pressure, pulse, and oxygen                         saturations were monitored continuously. The  Colonoscope was introduced through the anus and                         advanced to the the cecum, identified by appendiceal                         orifice and ileocecal valve. The colonoscopy was                         performed without difficulty. The patient tolerated                         the procedure well. The quality of the bowel                         preparation was evaluated using the BBPS [facility] Bowel                         Preparation Scale) with scores of: Right Colon = 3,                         Transverse Colon = 3 and Left Colon = 3 (entire mucosa                         seen well with no residual staining, small fragments                          of stool or opaque liquid). The total BBPS score                         equals 9. The ileocecal valve, appendiceal orifice,                         and rectum were photographed. Findings:      The perianal and digital rectal examinations were normal. Pertinent       negatives include normal sphincter tone.      Four sessile polyps were found in the rectum, descending colon,       ascending colon and cecum. The polyps were 3 to 4 mm in size. These       polyps were removed with a cold snare. Resection and retrieval were       complete. Estimated blood loss was minimal.      Five sessile polyps were found in the rectum, sigmoid colon, ascending       colon and cecum. The polyps were 1 to 2 mm in size. These polyps were       removed with a jumbo cold forceps. Resection and retrieval were       complete. Estimated blood loss was minimal.      Multiple small-mouthed diverticula were found in the left colon.       Estimated blood loss: none.      Non-bleeding internal hemorrhoids were found during retroflexion. The       hemorrhoids were Grade I (internal hemorrhoids that do not prolapse).       Estimated blood loss: none.      The exam was otherwise without abnormality on direct and retroflexion       views. Impression:            -  Four 3 to 4 mm polyps in the rectum, in the                         descending colon, in the ascending colon and in the                         cecum, removed with a cold snare. Resected and                         retrieved.                        - Five 1 to 2 mm polyps in the rectum, in the sigmoid                         colon, in the ascending colon and in the cecum,                         removed with a jumbo cold forceps. Resected and                         retrieved.                        - Diverticulosis in the left colon.                        - Non-bleeding internal hemorrhoids.                        - The examination was  otherwise normal on direct and                         retroflexion views. Recommendation:        - Patient has a contact number available for                         emergencies. The signs and symptoms of potential                         delayed complications were discussed with the patient.                         Return to normal activities tomorrow. Written                         discharge instructions were provided to the patient.                        - Discharge patient to home.                        - Resume previous diet.                        - Continue present medications.                        - Await pathology results.                        -  Repeat colonoscopy for surveillance based on                         pathology results.                        - Return to referring physician as previously                         scheduled.                        - The findings and recommendations were discussed with                         the patient. Procedure Code(s):     --- Professional ---                        [phone], Colonoscopy, flexible; with removal of                         tumor(s), polyp(s), or other lesion(s) by snare                         technique                        45380, 75, Colonoscopy, flexible; with biopsy, single                         or multiple Diagnosis Code(s):     --- Professional ---                        Z12.11, Encounter for screening for malignant neoplasm                         of colon                        K62.1, Rectal polyp                        K63.5, Polyp of colon                        K64.0, First degree hemorrhoids                        K57.30, Diverticulosis of large intestine without                         perforation or abscess without bleeding CPT copyright 2019 American Medical Association. All rights reserved. The codes documented in this report are preliminary and upon coder review may  be revised to meet  current compliance requirements. Attending Participation:      I personally performed the entire procedure. Volney American, DO Annamaria Helling DO, DO 08/23/2022 1:19:48 PM This report has been signed electronically. Number of Addenda: 0 Note Initiated On: 08/23/2022 12:16 PM Scope Withdrawal Time: 0 hours 31 minutes 34 seconds  Total Procedure Duration: 0 hours 36 minutes 42 seconds  Estimated Blood Loss:  Estimated blood loss was minimal.  [facility]

## 2022-08-23 NOTE — Interval H&P Note (Signed)
History and Physical Interval Note: Preprocedure H&P from 08/23/22  was reviewed and there was no interval change after seeing and examining the patient.  Written consent was obtained from the patient after discussion of risks, benefits, and alternatives. Patient has consented to proceed with Colonoscopy with possible intervention   08/23/2022 12:15 PM  Andrew Graham  has presented today for surgery, with the diagnosis of Colon cancer screening (Z12.11).  The various methods of treatment have been discussed with the patient and family. After consideration of risks, benefits and other options for treatment, the patient has consented to  Procedure(s): COLONOSCOPY (N/A) as a surgical intervention.  The patient's history has been reviewed, patient examined, no change in status, stable for surgery.  I have reviewed the patient's chart and labs.  Questions were answered to the patient's satisfaction.     Annamaria Helling

## 2022-08-23 NOTE — Anesthesia Procedure Notes (Signed)
Procedure Name: Intubation Date/Time: 08/23/2022 12:29 PM  Performed by: Cammie Sickle, CRNAPre-anesthesia Checklist: Patient identified, Patient being monitored, Timeout performed, Emergency Drugs available and Suction available Patient Re-evaluated:Patient Re-evaluated prior to induction Oxygen Delivery Method: Circle system utilized Preoxygenation: Pre-oxygenation with 100% oxygen Induction Type: IV induction Ventilation: Mask ventilation without difficulty Laryngoscope Size: 3 and McGraph Grade View: Grade I Tube type: Oral Tube size: 7.5 mm Number of attempts: 1 Airway Equipment and Method: Stylet Placement Confirmation: ETT inserted through vocal cords under direct vision, positive ETCO2 and breath sounds checked- equal and bilateral Secured at: 21 cm Tube secured with: Tape Dental Injury: Teeth and Oropharynx as per pre-operative assessment

## 2022-08-26 ENCOUNTER — Encounter: Payer: Self-pay | Admitting: Gastroenterology

## 2022-08-26 LAB — SURGICAL PATHOLOGY

## 2022-08-26 NOTE — Anesthesia Postprocedure Evaluation (Addendum)
Anesthesia Post Note  Patient: Andrew Graham  Procedure(s) Performed: COLONOSCOPY  Patient location during evaluation: Endoscopy Anesthesia Type: General Level of consciousness: awake and alert Pain management: pain level controlled Vital Signs Assessment: post-procedure vital signs reviewed and stable Respiratory status: spontaneous breathing, nonlabored ventilation, respiratory function stable and patient connected to nasal cannula oxygen Cardiovascular status: blood pressure returned to baseline and stable Postop Assessment: no apparent nausea or vomiting Anesthetic complications: no   No notable events documented.   Last Vitals:  Vitals:   08/23/22 1055 08/23/22 1323  BP: (!) 140/94   Pulse: 79   Resp: 20   Temp: (!) 35.5 C 36.8 C  SpO2: 100%     Last Pain:  Vitals:   08/24/22 1141  TempSrc:   PainSc: 0-No pain                 Dimas Millin

## 2022-10-15 ENCOUNTER — Ambulatory Visit: Payer: Federal, State, Local not specified - PPO | Admitting: Podiatry

## 2022-10-15 ENCOUNTER — Encounter: Payer: Self-pay | Admitting: Podiatry

## 2022-10-15 ENCOUNTER — Ambulatory Visit (INDEPENDENT_AMBULATORY_CARE_PROVIDER_SITE_OTHER): Payer: Federal, State, Local not specified - PPO

## 2022-10-15 VITALS — BP 109/75 | HR 92

## 2022-10-15 DIAGNOSIS — M722 Plantar fascial fibromatosis: Secondary | ICD-10-CM

## 2022-10-15 MED ORDER — METHYLPREDNISOLONE 4 MG PO TBPK: ORAL_TABLET | ORAL | 0 refills | Status: AC

## 2022-10-15 MED ORDER — MELOXICAM 15 MG PO TABS: 15.0000 mg | ORAL_TABLET | Freq: Every day | ORAL | 1 refills | Status: DC

## 2022-10-15 NOTE — Progress Notes (Signed)
   Chief Complaint  Patient presents with   Foot Pain    "I have pain in my left heel." N - heel pain L - plantar heel lt D - 4-6 weeks O - gradually got worse C - sharp pain A - put weight on it, sitting a while and getting up T - shift weight to the other foot    Subjective: 56 y.o. male    Past Medical History:  Diagnosis Date   Allergy    Arthritis    Diabetes mellitus without complication (HCC)    Glucose intolerance (impaired glucose tolerance)    Hypogonadism in male    Obstructive sleep apnea      Objective: Physical Exam General: The patient is alert and oriented x3 in no acute distress.  Dermatology: Skin is warm, dry and supple bilateral lower extremities. Negative for open lesions or macerations bilateral.   Vascular: Dorsalis Pedis and Posterior Tibial pulses palpable bilateral.  Capillary fill time is immediate to all digits.  Neurological: Epicritic and protective threshold intact bilateral.   Musculoskeletal: Tenderness to palpation to the plantar aspect of the left heel along the plantar fascia. All other joints range of motion within normal limits bilateral. Strength 5/5 in all groups bilateral.   Radiographic exam: Normal osseous mineralization. Joint spaces preserved. No fracture/dislocation/boney destruction. No other soft tissue abnormalities or radiopaque foreign bodies.  Posterior and plantar spurs noted on lateral view  Assessment: 1. Plantar fasciitis left foot  Plan of Care:  1. Patient evaluated. Xrays reviewed.   2.  Patient declined cortisone injection today 3. Rx for Medrol Dose Pak placed 4. Rx for Meloxicam ordered for patient. 5. Plantar fascial band(s) dispensed  6. Instructed patient regarding therapies and modalities at home to alleviate symptoms.  7.  Appointment with orthotics for custom molded insoles  8.  Return to clinic in 4 weeks.     Edrick Kins, DPM [facility]  Dr. Edrick Kins, DPM    [address], [place] [address]                Office [phone]  Fax [phone]

## 2022-10-22 ENCOUNTER — Ambulatory Visit (INDEPENDENT_AMBULATORY_CARE_PROVIDER_SITE_OTHER): Payer: Federal, State, Local not specified - PPO | Admitting: Podiatry

## 2022-10-22 DIAGNOSIS — M722 Plantar fascial fibromatosis: Secondary | ICD-10-CM

## 2022-10-22 NOTE — Progress Notes (Signed)
Patient presents today to be casted for custom molded orthotics. Dr. Amalia Hailey has been treating patient for ***.   Impression foam cast was taken. ABN signed.  Patient info-  Shoe size: 11  Shoe style: Athletic  Height: 5'7  Weight: 292  Insurance: [employer]   Patient will be notified once orthotics arrive in office and reappoint for fitting at that time.

## 2022-11-15 ENCOUNTER — Ambulatory Visit: Payer: Federal, State, Local not specified - PPO | Admitting: Podiatry

## 2022-11-15 DIAGNOSIS — M722 Plantar fascial fibromatosis: Secondary | ICD-10-CM

## 2022-11-15 NOTE — Progress Notes (Signed)
   No chief complaint on file.   Subjective: 57 y.o. male presenting today for follow-up evaluation of plantar fasciitis to the left foot.  Patient notices significant improvement.  Overall he only has minimal tenderness with the foot.  He has been fitted for orthotics and they are pending dispensable.   Past Medical History:  Diagnosis Date   Allergy    Arthritis    Diabetes mellitus without complication (HCC)    Glucose intolerance (impaired glucose tolerance)    Hypogonadism in male    Obstructive sleep apnea      Objective: Physical Exam General: The patient is alert and oriented x3 in no acute distress.  Dermatology: Skin is warm, dry and supple bilateral lower extremities. Negative for open lesions or macerations bilateral.   Vascular: Dorsalis Pedis and Posterior Tibial pulses palpable bilateral.  Capillary fill time is immediate to all digits.  Neurological: Epicritic and protective threshold intact bilateral.   Musculoskeletal: Minimal tenderness to palpation to the plantar aspect of the left heel along the plantar fascia. All other joints range of motion within normal limits bilateral. Strength 5/5 in all groups bilateral.   Radiographic exam LT foot 10/15/2022: Normal osseous mineralization. Joint spaces preserved. No fracture/dislocation/boney destruction. No other soft tissue abnormalities or radiopaque foreign bodies.  Posterior and plantar spurs noted on lateral view  Assessment: 1. Plantar fasciitis left foot  Plan of Care:  1. Patient evaluated.  2.  Overall patient is doing significantly better.  Continue wearing good supportive shoes and sneakers 3.  Continue meloxicam 15 mg daily as needed 4.  Custom molded orthotics pending 5.  Return to clinic for orthotics dispensable and with me as needed   Edrick Kins, DPM [facility]  Dr. Edrick Kins, DPM    [address], [place] [address]                 Office [phone]  Fax [phone]

## 2022-12-05 ENCOUNTER — Ambulatory Visit (INDEPENDENT_AMBULATORY_CARE_PROVIDER_SITE_OTHER): Payer: Federal, State, Local not specified - PPO

## 2022-12-05 DIAGNOSIS — M722 Plantar fascial fibromatosis: Secondary | ICD-10-CM | POA: Insufficient documentation

## 2022-12-10 ENCOUNTER — Other Ambulatory Visit: Payer: Self-pay | Admitting: Podiatry

## 2022-12-16 ENCOUNTER — Telehealth: Payer: Self-pay | Admitting: *Deleted

## 2022-12-16 NOTE — Telephone Encounter (Signed)
Patient is calling to ask if he should continue to take the meloxicam recently sent over by pharmacy, is doing fine and doesn't feel he needs any more at this time but wanted to check with physician first to make sure ok to discontinue,please advise.

## 2022-12-20 NOTE — Telephone Encounter (Signed)
Okay to discontinue. Please notify patient. - Dr. Amalia Hailey

## 2022-12-23 NOTE — Telephone Encounter (Signed)
Patient updated.

## 2023-01-01 ENCOUNTER — Other Ambulatory Visit: Payer: Self-pay | Admitting: Family Medicine

## 2023-01-01 ENCOUNTER — Ambulatory Visit
Admission: RE | Admit: 2023-01-01 | Discharge: 2023-01-01 | Disposition: A | Discharge status: Home / Self Care | Payer: Federal, State, Local not specified - PPO | Source: Ambulatory Visit | Attending: Family Medicine | Admitting: Family Medicine

## 2023-01-01 DIAGNOSIS — M7989 Other specified soft tissue disorders: Secondary | ICD-10-CM | POA: Diagnosis not present
